# Patient Record
Sex: Male | Born: 1975
Health system: Southern US, Community
[De-identification: ages and names within clinical notes are randomized; demographics above are authoritative.]

## PROBLEM LIST (undated history)

## (undated) DIAGNOSIS — K219 Gastro-esophageal reflux disease without esophagitis: Secondary | ICD-10-CM

---

## 2003-07-10 ENCOUNTER — Encounter: Admission: RE | Admit: 2003-07-10 | Discharge: 2003-08-21 | Payer: Self-pay | Admitting: Family Medicine

## 2012-02-01 ENCOUNTER — Emergency Department (INDEPENDENT_AMBULATORY_CARE_PROVIDER_SITE_OTHER)
Admission: EM | Admit: 2012-02-01 | Discharge: 2012-02-01 | Disposition: A | Discharge status: Home / Self Care | Payer: Self-pay | Source: Home / Self Care | Attending: Family Medicine | Admitting: Family Medicine

## 2012-02-01 ENCOUNTER — Emergency Department (INDEPENDENT_AMBULATORY_CARE_PROVIDER_SITE_OTHER): Payer: Self-pay

## 2012-02-01 ENCOUNTER — Encounter (HOSPITAL_COMMUNITY): Payer: Self-pay | Admitting: *Deleted

## 2012-02-01 DIAGNOSIS — J111 Influenza due to unidentified influenza virus with other respiratory manifestations: Secondary | ICD-10-CM

## 2012-02-01 MED ORDER — HYDROCOD POLST-CHLORPHEN POLST 10-8 MG/5ML PO LQCR: 5.0000 mL | Freq: Two times a day (BID) | ORAL | Status: DC | PRN

## 2012-02-01 MED ORDER — BENZONATATE 200 MG PO CAPS: 200.0000 mg | ORAL_CAPSULE | Freq: Three times a day (TID) | ORAL | Status: DC | PRN

## 2012-02-01 NOTE — ED Notes (Signed)
Pt   Reports  Symptoms  Of           Cough   Congestion  /  Chills  X  5  Days   Pt    Reports                   Symptoms  Not  releived  By  otc     meds        He  Is  Speaking in  Complete  sentances

## 2012-02-01 NOTE — ED Provider Notes (Signed)
History     CSN: [id number]  Arrival date & time 02/01/12  1139   First MD Initiated Contact with Patient 02/01/12 1416      Chief Complaint  Patient presents with  . Cough    (Consider location/radiation/quality/duration/timing/severity/associated sxs/prior treatment) Patient is a 36 y.o. male presenting with cough. The history is provided by the patient.  Cough This is a new problem. The current episode started more than 2 days ago. The problem has been gradually worsening. The cough is non-productive. The maximum temperature recorded prior to his arrival was 101 to 101.9 F. The fever has been present for 1 to 2 days. Associated symptoms include rhinorrhea. Pertinent negatives include no chills, no sore throat, no myalgias, no shortness of breath and no wheezing. Associated symptoms comments: Vomited x1 on thurs, fever broke after 1 day, no flu shot this season.Marland Kitchen He is a smoker.    History reviewed. No pertinent past medical history.  History reviewed. No pertinent past surgical history.  No family history on file.  History  Substance Use Topics  . Smoking status: Current Every Day Smoker  . Smokeless tobacco: Not on file  . Alcohol Use: Yes      Review of Systems  Constitutional: Positive for fever. Negative for chills.  HENT: Positive for congestion and rhinorrhea. Negative for sore throat.   Respiratory: Positive for cough. Negative for shortness of breath and wheezing.   Gastrointestinal: Positive for vomiting. Negative for nausea and diarrhea.  Musculoskeletal: Negative for myalgias.    Allergies  Review of patient's allergies indicates no known allergies.  Home Medications   Current Outpatient Rx  Name  Route  Sig  Dispense  Refill  . BENZONATATE 200 MG PO CAPS   Oral   Take 1 capsule (200 mg total) by mouth 3 (three) times daily as needed for cough.   20 capsule   0   . HYDROCOD POLST-CPM POLST ER 10-8 MG/5ML PO LQCR   Oral   Take 5 mLs by mouth  every 12 (twelve) hours as needed. As needed for cough   115 mL   1     BP 126/93  Pulse 86  Temp 98.5 F (36.9 C) (Oral)  Resp 16  SpO2 97%  Physical Exam  Nursing note and vitals reviewed. Constitutional: He is oriented to person, place, and time. He appears well-developed and well-nourished.  HENT:  Head: Normocephalic.  Right Ear: External ear normal.  Left Ear: External ear normal.  Nose: Nose normal.  Mouth/Throat: Oropharynx is clear and moist.  Eyes: Pupils are equal, round, and reactive to light.  Neck: Normal range of motion. Neck supple.  Cardiovascular: Normal rate, regular rhythm, normal heart sounds and intact distal pulses.   Pulmonary/Chest: Effort normal. He has rhonchi.  Abdominal: Soft. Bowel sounds are normal.  Lymphadenopathy:    He has no cervical adenopathy.  Neurological: He is alert and oriented to person, place, and time.  Skin: Skin is warm and dry.    ED Course  Procedures (including critical care time)  Labs Reviewed - No data to display Dg Chest 2 View  02/01/2012  **ADDENDUM** CREATED: 02/01/2012 15:08:32  This addendum is to correct a "left/right" error in the original report.  The IMPRESSION should read as follows:  IMPRESSION: Minimal atelectasis left mid lung. Otherwise, negative exam.  **END ADDENDUM** SIGNED BY: Britta Mccreedy, M.D.   02/01/2012  *RADIOLOGY REPORT*  Clinical Data: Low grade fever, sore throat and cough  CHEST -  2 VIEW  Comparison: None.  Findings: The heart, mediastinal, and hilar contours are within normal limits.  There is minimal streaky atelectasis in the left mid lung.  No airspace disease, effusion, or pneumothorax.  The bones are unremarkable.  IMPRESSION: Minimal atelectasis right mid lung.  Otherwise, negative exam.   Original Report Authenticated By: Britta Mccreedy, M.D.      1. Influenza-like illness       MDM  X-rays reviewed and report per radiologist.         Linna Hoff, MD 02/01/12 1515

## 2012-06-27 DIAGNOSIS — D126 Benign neoplasm of colon, unspecified: Secondary | ICD-10-CM

## 2015-08-25 DIAGNOSIS — R030 Elevated blood-pressure reading, without diagnosis of hypertension: Secondary | ICD-10-CM | POA: Diagnosis not present

## 2015-08-25 DIAGNOSIS — S335XXA Sprain of ligaments of lumbar spine, initial encounter: Secondary | ICD-10-CM | POA: Diagnosis not present

## 2016-03-01 DIAGNOSIS — D2330 Other benign neoplasm of skin of unspecified part of face: Secondary | ICD-10-CM | POA: Diagnosis not present

## 2016-03-01 DIAGNOSIS — L700 Acne vulgaris: Secondary | ICD-10-CM | POA: Diagnosis not present

## 2016-05-13 DIAGNOSIS — E782 Mixed hyperlipidemia: Secondary | ICD-10-CM | POA: Diagnosis not present

## 2016-05-13 DIAGNOSIS — F419 Anxiety disorder, unspecified: Secondary | ICD-10-CM | POA: Diagnosis not present

## 2016-05-13 DIAGNOSIS — E781 Pure hyperglyceridemia: Secondary | ICD-10-CM | POA: Diagnosis not present

## 2016-05-13 DIAGNOSIS — F329 Major depressive disorder, single episode, unspecified: Secondary | ICD-10-CM | POA: Diagnosis not present

## 2016-12-07 DIAGNOSIS — B349 Viral infection, unspecified: Secondary | ICD-10-CM | POA: Diagnosis not present

## 2017-05-09 DIAGNOSIS — L700 Acne vulgaris: Secondary | ICD-10-CM | POA: Diagnosis not present

## 2019-07-24 ENCOUNTER — Ambulatory Visit: Payer: Self-pay | Admitting: Family Medicine

## 2019-07-26 ENCOUNTER — Ambulatory Visit: Payer: Self-pay | Admitting: Family Medicine

## 2019-07-26 DIAGNOSIS — Z0289 Encounter for other administrative examinations: Secondary | ICD-10-CM

## 2019-08-07 ENCOUNTER — Inpatient Hospital Stay (HOSPITAL_COMMUNITY)
Admission: EM | Admit: 2019-08-07 | Discharge: 2019-08-11 | DRG: 638 | Disposition: A | Discharge status: Home / Self Care | Payer: BLUE CROSS/BLUE SHIELD | Attending: Internal Medicine | Admitting: Internal Medicine

## 2019-08-07 ENCOUNTER — Encounter (HOSPITAL_COMMUNITY): Payer: Self-pay

## 2019-08-07 ENCOUNTER — Other Ambulatory Visit: Payer: Self-pay

## 2019-08-07 DIAGNOSIS — F17210 Nicotine dependence, cigarettes, uncomplicated: Secondary | ICD-10-CM | POA: Diagnosis present

## 2019-08-07 DIAGNOSIS — E119 Type 2 diabetes mellitus without complications: Secondary | ICD-10-CM | POA: Diagnosis not present

## 2019-08-07 DIAGNOSIS — Z7289 Other problems related to lifestyle: Secondary | ICD-10-CM | POA: Diagnosis not present

## 2019-08-07 DIAGNOSIS — E860 Dehydration: Secondary | ICD-10-CM | POA: Diagnosis not present

## 2019-08-07 DIAGNOSIS — R110 Nausea: Secondary | ICD-10-CM | POA: Diagnosis not present

## 2019-08-07 DIAGNOSIS — Z79899 Other long term (current) drug therapy: Secondary | ICD-10-CM | POA: Diagnosis not present

## 2019-08-07 DIAGNOSIS — E0810 Diabetes mellitus due to underlying condition with ketoacidosis without coma: Secondary | ICD-10-CM

## 2019-08-07 DIAGNOSIS — Z22322 Carrier or suspected carrier of Methicillin resistant Staphylococcus aureus: Secondary | ICD-10-CM | POA: Diagnosis not present

## 2019-08-07 DIAGNOSIS — K219 Gastro-esophageal reflux disease without esophagitis: Secondary | ICD-10-CM | POA: Diagnosis present

## 2019-08-07 DIAGNOSIS — R739 Hyperglycemia, unspecified: Secondary | ICD-10-CM | POA: Diagnosis not present

## 2019-08-07 DIAGNOSIS — R404 Transient alteration of awareness: Secondary | ICD-10-CM | POA: Diagnosis not present

## 2019-08-07 DIAGNOSIS — R112 Nausea with vomiting, unspecified: Secondary | ICD-10-CM

## 2019-08-07 DIAGNOSIS — Z833 Family history of diabetes mellitus: Secondary | ICD-10-CM

## 2019-08-07 DIAGNOSIS — E1110 Type 2 diabetes mellitus with ketoacidosis without coma: Principal | ICD-10-CM | POA: Diagnosis present

## 2019-08-07 DIAGNOSIS — E1165 Type 2 diabetes mellitus with hyperglycemia: Secondary | ICD-10-CM | POA: Diagnosis not present

## 2019-08-07 DIAGNOSIS — R000 Tachycardia, unspecified: Secondary | ICD-10-CM | POA: Diagnosis not present

## 2019-08-07 DIAGNOSIS — N179 Acute kidney failure, unspecified: Secondary | ICD-10-CM | POA: Diagnosis not present

## 2019-08-07 DIAGNOSIS — Z20822 Contact with and (suspected) exposure to covid-19: Secondary | ICD-10-CM | POA: Diagnosis present

## 2019-08-07 DIAGNOSIS — R0689 Other abnormalities of breathing: Secondary | ICD-10-CM | POA: Diagnosis not present

## 2019-08-07 DIAGNOSIS — E1010 Type 1 diabetes mellitus with ketoacidosis without coma: Secondary | ICD-10-CM | POA: Diagnosis not present

## 2019-08-07 HISTORY — DX: Gastro-esophageal reflux disease without esophagitis: K21.9

## 2019-08-07 LAB — CBC WITH DIFFERENTIAL/PLATELET
Abs Immature Granulocytes: 0.42 10*3/uL — ABNORMAL HIGH (ref 0.00–0.07)
Basophils Absolute: 0.1 10*3/uL (ref 0.0–0.1)
Basophils Relative: 0 %
Eosinophils Absolute: 0.1 10*3/uL (ref 0.0–0.5)
Eosinophils Relative: 1 %
HCT: 53.1 % — ABNORMAL HIGH (ref 39.0–52.0)
Hemoglobin: 17.3 g/dL — ABNORMAL HIGH (ref 13.0–17.0)
Immature Granulocytes: 2 %
Lymphocytes Relative: 7 %
Lymphs Abs: 1.5 10*3/uL (ref 0.7–4.0)
MCH: 28.5 pg (ref 26.0–34.0)
MCHC: 32.6 g/dL (ref 30.0–36.0)
MCV: 87.3 fL (ref 80.0–100.0)
Monocytes Absolute: 2.1 10*3/uL — ABNORMAL HIGH (ref 0.1–1.0)
Monocytes Relative: 10 %
Neutro Abs: 16.5 10*3/uL — ABNORMAL HIGH (ref 1.7–7.7)
Neutrophils Relative %: 80 %
Platelets: 326 10*3/uL (ref 150–400)
RBC: 6.08 MIL/uL — ABNORMAL HIGH (ref 4.22–5.81)
RDW: 12.5 % (ref 11.5–15.5)
WBC: 20.7 10*3/uL — ABNORMAL HIGH (ref 4.0–10.5)
nRBC: 0 % (ref 0.0–0.2)

## 2019-08-07 LAB — COMPREHENSIVE METABOLIC PANEL
ALT: 135 U/L — ABNORMAL HIGH (ref 0–44)
AST: 37 U/L (ref 15–41)
Albumin: 4.2 g/dL (ref 3.5–5.0)
Alkaline Phosphatase: 115 U/L (ref 38–126)
Anion gap: 32 — ABNORMAL HIGH (ref 5–15)
BUN: 48 mg/dL — ABNORMAL HIGH (ref 6–20)
CO2: 10 mmol/L — ABNORMAL LOW (ref 22–32)
Calcium: 9 mg/dL (ref 8.9–10.3)
Chloride: 90 mmol/L — ABNORMAL LOW (ref 98–111)
Creatinine, Ser: 2.41 mg/dL — ABNORMAL HIGH (ref 0.61–1.24)
GFR calc Af Amer: 36 mL/min — ABNORMAL LOW (ref >60)
GFR calc non Af Amer: 31 mL/min — ABNORMAL LOW (ref >60)
Glucose, Bld: 943 mg/dL (ref 70–99)
Potassium: 5.4 mmol/L — ABNORMAL HIGH (ref 3.5–5.1)
Sodium: 132 mmol/L — ABNORMAL LOW (ref 135–145)
Total Bilirubin: 2.3 mg/dL — ABNORMAL HIGH (ref 0.3–1.2)
Total Protein: 7.4 g/dL (ref 6.5–8.1)

## 2019-08-07 LAB — BLOOD GAS, VENOUS
Acid-base deficit: 21.2 mmol/L — ABNORMAL HIGH (ref 0.0–2.0)
Bicarbonate: 7.6 mmol/L — ABNORMAL LOW (ref 20.0–28.0)
O2 Saturation: 59.9 %
Patient temperature: 98.6
pCO2, Ven: 23.6 mmHg — ABNORMAL LOW (ref 44.0–60.0)
pH, Ven: 7.132 — CL (ref 7.250–7.430)
pO2, Ven: 46.3 mmHg — ABNORMAL HIGH (ref 32.0–45.0)

## 2019-08-07 LAB — LACTIC ACID, PLASMA: Lactic Acid, Venous: 3.4 mmol/L (ref 0.5–1.9)

## 2019-08-07 LAB — CBG MONITORING, ED: Glucose-Capillary: >600 mg/dL (ref 70–99)

## 2019-08-07 LAB — LIPASE, BLOOD: Lipase: 118 U/L — ABNORMAL HIGH (ref 11–51)

## 2019-08-07 MED ORDER — SODIUM CHLORIDE 0.9 % IV SOLN: INTRAVENOUS | Status: DC

## 2019-08-07 MED ORDER — DEXTROSE-NACL 5-0.45 % IV SOLN: INTRAVENOUS | Status: DC

## 2019-08-07 MED ORDER — HEPARIN SODIUM (PORCINE) 5000 UNIT/ML IJ SOLN
5000.0000 [IU] | Freq: Three times a day (TID) | INTRAMUSCULAR | Status: DC
  Administered 2019-08-08 – 2019-08-11 (×10): 5000 [IU] via SUBCUTANEOUS
  Filled 2019-08-07 (×9): qty 1

## 2019-08-07 MED ORDER — INSULIN REGULAR(HUMAN) IN NACL 100-0.9 UT/100ML-% IV SOLN: INTRAVENOUS | Status: DC

## 2019-08-07 MED ORDER — DEXTROSE 50 % IV SOLN: 0.0000 mL | INTRAVENOUS | Status: DC | PRN

## 2019-08-07 MED ORDER — SODIUM CHLORIDE 0.9 % IV BOLUS
500.0000 mL | Freq: Once | INTRAVENOUS | Status: AC
  Administered 2019-08-08: 500 mL via INTRAVENOUS

## 2019-08-07 MED ORDER — INSULIN REGULAR(HUMAN) IN NACL 100-0.9 UT/100ML-% IV SOLN
INTRAVENOUS | Status: DC
  Administered 2019-08-08: 6 [IU]/h via INTRAVENOUS
  Filled 2019-08-07: qty 100

## 2019-08-07 MED ORDER — SODIUM CHLORIDE 0.9 % IV BOLUS
500.0000 mL | Freq: Once | INTRAVENOUS | Status: AC
  Administered 2019-08-07: 500 mL via INTRAVENOUS

## 2019-08-07 MED ORDER — SODIUM CHLORIDE 0.9 % IV BOLUS
1000.0000 mL | Freq: Once | INTRAVENOUS | Status: AC
  Administered 2019-08-08: 1000 mL via INTRAVENOUS

## 2019-08-07 NOTE — ED Triage Notes (Addendum)
Pt BIB EMS from home. Pts sister reports loss of appetite and emesis x10 days. Pt went to UC yesterday and was given Zofran without relief. A&O x4.   CBG 560 4mg  Zofran 18G LAC 1L of NS HR 108 BP 108/60 99% RA RR 30

## 2019-08-07 NOTE — ED Provider Notes (Signed)
[facility] DEPT Provider Note   CSN: [id number] Arrival date & time: 08/07/19  2145     History Chief Complaint  Patient presents with  . Emesis  . Hyperglycemia    Gary Ramirez is a 44 y.o. male.  The history is provided by the patient and medical records. No language interpreter was used.  Emesis Severity:  Severe Duration:  11 days Timing:  Constant Quality:  Stomach contents Progression:  Worsening Chronicity:  New Recent urination:  Increased Relieved by:  Nothing Worsened by:  Nothing Ineffective treatments:  None tried Associated symptoms: abdominal pain   Associated symptoms: no chills, no cough, no diarrhea, no fever, no headaches, no myalgias, no sore throat and no URI   Risk factors: no diabetes   Hyperglycemia Associated symptoms: abdominal pain, fatigue, nausea and vomiting   Associated symptoms: no chest pain, no confusion, no diaphoresis, no dizziness, no dysuria, no fever, no shortness of breath and no weakness        Past Medical History:  Diagnosis Date  . GERD (gastroesophageal reflux disease)     There are no problems to display for this patient.   History reviewed. No pertinent surgical history.     History reviewed. No pertinent family history.  Social History   Tobacco Use  . Smoking status: Current Every Day Smoker  Substance Use Topics  . Alcohol use: Yes  . Drug use: Not on file    Home Medications Prior to Admission medications   Medication Sig Start Date End Date Taking? Authorizing Provider  benzonatate (TESSALON) 200 MG capsule Take 1 capsule (200 mg total) by mouth 3 (three) times daily as needed for cough. 02/01/12   Billy Fischer, MD  chlorpheniramine-HYDROcodone (TUSSIONEX PENNKINETIC ER) 10-8 MG/5ML LQCR Take 5 mLs by mouth every 12 (twelve) hours as needed. As needed for cough 02/01/12   Billy Fischer, MD  FLUoxetine (PROZAC) 40 MG capsule Take 40 mg by mouth at bedtime.  05/23/19   [provider]  pantoprazole (PROTONIX) 40 MG tablet Take 1 tablet by mouth daily. 05/28/19   [provider]    Allergies    Patient has no known allergies.  Review of Systems   Review of Systems  Constitutional: Positive for appetite change and fatigue. Negative for chills, diaphoresis and fever.  HENT: Negative for congestion and sore throat.   Eyes: Negative for visual disturbance.  Respiratory: Negative for cough, choking, chest tightness, shortness of breath, wheezing and stridor.   Cardiovascular: Negative for chest pain, palpitations and leg swelling.  Gastrointestinal: Positive for abdominal pain, nausea and vomiting. Negative for abdominal distention, anal bleeding, blood in stool, constipation and diarrhea.  Genitourinary: Negative for dysuria, flank pain and frequency.  Musculoskeletal: Negative for back pain, myalgias, neck pain and neck stiffness.  Skin: Negative for rash and wound.  Neurological: Negative for dizziness, weakness, light-headedness and headaches.  Psychiatric/Behavioral: Negative for agitation, behavioral problems and confusion.  All other systems reviewed and are negative.   Physical Exam Updated Vital Signs BP 122/71 (BP Location: Right Arm)   Pulse (!) 108   Temp (!) 97.2 F (36.2 C) (Axillary)   Resp (!) 22   SpO2 100%   Physical Exam Vitals and nursing note reviewed.  Constitutional:      General: He is not in acute distress.    Appearance: He is well-developed. He is ill-appearing. He is not toxic-appearing or diaphoretic.  HENT:     Head: Normocephalic and  atraumatic.     Nose: No congestion or rhinorrhea.     Mouth/Throat:     Mouth: Mucous membranes are dry.     Pharynx: No oropharyngeal exudate or posterior oropharyngeal erythema.  Eyes:     Extraocular Movements: Extraocular movements intact.     Conjunctiva/sclera: Conjunctivae normal.     Pupils: Pupils are equal, round, and reactive to light.    Cardiovascular:     Rate and Rhythm: Regular rhythm. Tachycardia present.     Pulses: Normal pulses.     Heart sounds: No murmur heard.   Pulmonary:     Effort: Pulmonary effort is normal. No respiratory distress.     Breath sounds: Normal breath sounds. No wheezing, rhonchi or rales.  Chest:     Chest wall: No tenderness.  Abdominal:     General: Abdomen is flat.     Palpations: Abdomen is soft.     Tenderness: There is no abdominal tenderness. There is no right CVA tenderness, left CVA tenderness, guarding or rebound.  Musculoskeletal:        General: No tenderness.     Cervical back: Neck supple. No tenderness.     Right lower leg: No edema.     Left lower leg: No edema.  Skin:    General: Skin is warm and dry.     Capillary Refill: Capillary refill takes less than 2 seconds.     Findings: No erythema.  Neurological:     Mental Status: He is alert.     Sensory: No sensory deficit.     Motor: No weakness.  Psychiatric:        Mood and Affect: Mood normal.     ED Results / Procedures / Treatments   Labs (all labs ordered are listed, but only abnormal results are displayed) Labs Reviewed  CBC WITH DIFFERENTIAL/PLATELET - Abnormal; Notable for the following components:      Result Value   WBC 20.7 (*)    RBC 6.08 (*)    Hemoglobin 17.3 (*)    HCT 53.1 (*)    Neutro Abs 16.5 (*)    Monocytes Absolute 2.1 (*)    Abs Immature Granulocytes 0.42 (*)    All other components within normal limits  COMPREHENSIVE METABOLIC PANEL - Abnormal; Notable for the following components:   Sodium 132 (*)    Potassium 5.4 (*)    Chloride 90 (*)    CO2 10 (*)    Glucose, Bld 943 (*)    BUN 48 (*)    Creatinine, Ser 2.41 (*)    ALT 135 (*)    Total Bilirubin 2.3 (*)    GFR calc non Af Amer 31 (*)    GFR calc Af Amer 36 (*)    Anion gap 32 (*)    All other components within normal limits  LIPASE, BLOOD - Abnormal; Notable for the following components:   Lipase 118 (*)    All  other components within normal limits  LACTIC ACID, PLASMA - Abnormal; Notable for the following components:   Lactic Acid, Venous 3.4 (*)    All other components within normal limits  BETA-HYDROXYBUTYRIC ACID - Abnormal; Notable for the following components:   Beta-Hydroxybutyric Acid >8.00 (*)    All other components within normal limits  BLOOD GAS, VENOUS - Abnormal; Notable for the following components:   pH, Ven 7.132 (*)    pCO2, Ven 23.6 (*)    pO2, Ven 46.3 (*)    Bicarbonate  7.6 (*)    Acid-base deficit 21.2 (*)    All other components within normal limits  CBG MONITORING, ED - Abnormal; Notable for the following components:   Glucose-Capillary >600 (*)    All other components within normal limits  CBG MONITORING, ED - Abnormal; Notable for the following components:   Glucose-Capillary >600 (*)    All other components within normal limits  URINE CULTURE  SARS CORONAVIRUS 2 BY RT PCR (HOSPITAL ORDER, [facility])  LACTIC ACID, PLASMA  URINALYSIS, ROUTINE W REFLEX MICROSCOPIC  HIV ANTIBODY (ROUTINE TESTING W REFLEX)  BASIC METABOLIC PANEL  BASIC METABOLIC PANEL  BASIC METABOLIC PANEL  BASIC METABOLIC PANEL  BASIC METABOLIC PANEL  HEMOGLOBIN A1C  C-PEPTIDE    EKG EKG Interpretation  Date/Time:  Tuesday August 07 2019 22:47:13 EDT Ventricular Rate:  107 PR Interval:    QRS Duration: 102 QT Interval:  411 QTC Calculation: 549 R Axis:   112 Text Interpretation: Sinus tachycardia Atrial premature complexes Right axis deviation Prolonged QT interval NO prior ECG for comparison. Long QTC. No STEMI Confirmed by Antony Blackbird [phone]) on 08/07/2019 10:59:37 PM   Radiology No results found.  Procedures Procedures (including critical care time)  CRITICAL CARE Performed by: Gwenyth Allegra Izaias Krupka Total critical care time: 45 minutes Critical care time was exclusive of separately billable procedures and treating other patients. Critical care was  necessary to treat or prevent imminent or life-threatening deterioration. Critical care was time spent personally by me on the following activities: development of treatment plan with patient and/or surrogate as well as nursing, discussions with consultants, evaluation of patient's response to treatment, examination of patient, obtaining history from patient or surrogate, ordering and performing treatments and interventions, ordering and review of laboratory studies, ordering and review of radiographic studies, pulse oximetry and re-evaluation of patient's condition.   Medications Ordered in ED Medications  insulin regular, human (MYXREDLIN) 100 units/ 100 mL infusion (has no administration in time range)  0.9 %  sodium chloride infusion (has no administration in time range)  dextrose 5 %-0.45 % sodium chloride infusion (has no administration in time range)  dextrose 50 % solution 0-50 mL (has no administration in time range)  heparin injection 5,000 Units (has no administration in time range)  sodium chloride 0.9 % bolus 500 mL (0 mLs Intravenous Stopped 08/08/19 0014)  sodium chloride 0.9 % bolus 1,000 mL (1,000 mLs Intravenous New Bag/Given 08/08/19 0014)  sodium chloride 0.9 % bolus 500 mL (500 mLs Intravenous New Bag/Given 08/08/19 0010)    ED Course  I have reviewed the triage vital signs and the nursing notes.  Pertinent labs & imaging results that were available during my care of the patient were reviewed by me and considered in my medical decision making (see chart for details).    MDM Rules/Calculators/A&P                          Gary Ramirez is a 44 y.o. male with a past medical history significant for GERD who presents with nausea, vomiting, fatigue, altered mental status, polyuria, and dehydration.  Patient reports that for the last week and half he has been having near constant nausea vomiting with any p.o. intake.  He reports some mild intermittent abdominal discomfort.  He  went to urgent care yesterday and was given some Zofran without significant relief.  He is accompanied by his sister who is able to provide a  lot of history.  Patient is disoriented to time and feels he is very dehydrated.  He reports he has been peeing more but denies dysuria.  Denies significant fevers, chills, congestion, or cough.  Denies constipation or diarrhea.  Reports just constant vomiting.  He does deny any history of diabetes but his sister reports that you have a family history of late onset diabetes.  He has not been trying other medication to help.  On exam, patient is tachycardic but is afebrile.  He is not hypotensive.  He is laying on his exam bed and is answering questions but he is very tired appearing.  He is not somnolent as he is able to answer questions to voice.  Abdomen is nontender.  Normal bowel sounds.  Chest is nontender.  Lungs clear.  No murmur.  Patient moving all extremities with normal sensation and strength in extremities.  Pupils are symmetric reactive normal extraocular movements.  No evidence of trauma.  No CVA tenderness.  Patient had a CBG that was greater than 600.  Clinically I am very concerned about DKA.  Patient received 1 L of fluids with EMS, will give 500 to start and get his labs to confirm DKA.  He is already some Zofran and nausea starting to improve.  At this time, do not suspect any cerebral edema given his lack of headache or neurologic deficits but he is very tired.  Suspect is related dehydration.  Will wait for labs to return and then continue his hyperglycemia management.  Anticipate admission for likely new onset diabetes and possible DKA.  We will go ahead and get Covid swab.  11:43 PM Labs have returned and we have confirmed DKA.  pH is 7.13.  Bicarb is low at 10. at patient has AKI of 2.41.  Glucose is 943 on CMP.  Also the leukocytosis may be from demargination with vomiting.  Lipase elevated.  Anion gap calculated to be 32.  We will start the  insulin drip and admit for further management to medicine service.    Final Clinical Impression(s) / ED Diagnoses Final diagnoses:  Diabetic ketoacidosis without coma associated with diabetes mellitus due to underlying condition ([place])  Hyperglycemia  Intractable vomiting with nausea, unspecified vomiting type     Clinical Impression: 1. Diabetic ketoacidosis without coma associated with diabetes mellitus due to underlying condition ([place])   2. Hyperglycemia   3. Intractable vomiting with nausea, unspecified vomiting type     Disposition: Admit  This note was prepared with assistance of Dragon voice recognition software. Occasional wrong-word or sound-a-like substitutions may have occurred due to the inherent limitations of voice recognition software.     Townes Fuhs, Gwenyth Allegra, MD 08/08/19 [phone]

## 2019-08-08 ENCOUNTER — Encounter (HOSPITAL_COMMUNITY): Payer: Self-pay | Admitting: Internal Medicine

## 2019-08-08 DIAGNOSIS — N179 Acute kidney failure, unspecified: Secondary | ICD-10-CM

## 2019-08-08 DIAGNOSIS — E1110 Type 2 diabetes mellitus with ketoacidosis without coma: Principal | ICD-10-CM

## 2019-08-08 DIAGNOSIS — R739 Hyperglycemia, unspecified: Secondary | ICD-10-CM

## 2019-08-08 DIAGNOSIS — R112 Nausea with vomiting, unspecified: Secondary | ICD-10-CM

## 2019-08-08 DIAGNOSIS — E119 Type 2 diabetes mellitus without complications: Secondary | ICD-10-CM

## 2019-08-08 LAB — RAPID URINE DRUG SCREEN, HOSP PERFORMED
Amphetamines: NOT DETECTED
Barbiturates: NOT DETECTED
Benzodiazepines: NOT DETECTED
Cocaine: NOT DETECTED
Opiates: NOT DETECTED
Tetrahydrocannabinol: POSITIVE — AB

## 2019-08-08 LAB — GLUCOSE, CAPILLARY
Glucose-Capillary: 216 mg/dL — ABNORMAL HIGH (ref 70–99)
Glucose-Capillary: 216 mg/dL — ABNORMAL HIGH (ref 70–99)
Glucose-Capillary: 218 mg/dL — ABNORMAL HIGH (ref 70–99)
Glucose-Capillary: 228 mg/dL — ABNORMAL HIGH (ref 70–99)
Glucose-Capillary: 246 mg/dL — ABNORMAL HIGH (ref 70–99)
Glucose-Capillary: 257 mg/dL — ABNORMAL HIGH (ref 70–99)
Glucose-Capillary: 264 mg/dL — ABNORMAL HIGH (ref 70–99)
Glucose-Capillary: 264 mg/dL — ABNORMAL HIGH (ref 70–99)
Glucose-Capillary: 265 mg/dL — ABNORMAL HIGH (ref 70–99)
Glucose-Capillary: 265 mg/dL — ABNORMAL HIGH (ref 70–99)
Glucose-Capillary: 279 mg/dL — ABNORMAL HIGH (ref 70–99)
Glucose-Capillary: 295 mg/dL — ABNORMAL HIGH (ref 70–99)
Glucose-Capillary: 329 mg/dL — ABNORMAL HIGH (ref 70–99)
Glucose-Capillary: 376 mg/dL — ABNORMAL HIGH (ref 70–99)
Glucose-Capillary: 408 mg/dL — ABNORMAL HIGH (ref 70–99)
Glucose-Capillary: 437 mg/dL — ABNORMAL HIGH (ref 70–99)
Glucose-Capillary: 450 mg/dL — ABNORMAL HIGH (ref 70–99)
Glucose-Capillary: 467 mg/dL — ABNORMAL HIGH (ref 70–99)
Glucose-Capillary: 566 mg/dL (ref 70–99)
Glucose-Capillary: >600 mg/dL (ref 70–99)
Glucose-Capillary: >600 mg/dL (ref 70–99)
Glucose-Capillary: >600 mg/dL (ref 70–99)
Glucose-Capillary: >600 mg/dL (ref 70–99)

## 2019-08-08 LAB — BASIC METABOLIC PANEL
Anion gap: 32 — ABNORMAL HIGH (ref 5–15)
Anion gap: 19 — ABNORMAL HIGH (ref 5–15)
Anion gap: 19 — ABNORMAL HIGH (ref 5–15)
Anion gap: 18 — ABNORMAL HIGH (ref 5–15)
Anion gap: 14 (ref 5–15)
BUN: 50 mg/dL — ABNORMAL HIGH (ref 6–20)
BUN: 47 mg/dL — ABNORMAL HIGH (ref 6–20)
BUN: 41 mg/dL — ABNORMAL HIGH (ref 6–20)
BUN: 38 mg/dL — ABNORMAL HIGH (ref 6–20)
BUN: 34 mg/dL — ABNORMAL HIGH (ref 6–20)
BUN: 31 mg/dL — ABNORMAL HIGH (ref 6–20)
CO2: <7 mmol/L — ABNORMAL LOW (ref 22–32)
CO2: 7 mmol/L — ABNORMAL LOW (ref 22–32)
CO2: 13 mmol/L — ABNORMAL LOW (ref 22–32)
CO2: 15 mmol/L — ABNORMAL LOW (ref 22–32)
CO2: 16 mmol/L — ABNORMAL LOW (ref 22–32)
CO2: 17 mmol/L — ABNORMAL LOW (ref 22–32)
Calcium: 8.6 mg/dL — ABNORMAL LOW (ref 8.9–10.3)
Calcium: 8.9 mg/dL (ref 8.9–10.3)
Calcium: 8.6 mg/dL — ABNORMAL LOW (ref 8.9–10.3)
Calcium: 8.9 mg/dL (ref 8.9–10.3)
Calcium: 8.6 mg/dL — ABNORMAL LOW (ref 8.9–10.3)
Calcium: 9 mg/dL (ref 8.9–10.3)
Chloride: 102 mmol/L (ref 98–111)
Chloride: 101 mmol/L (ref 98–111)
Chloride: 115 mmol/L — ABNORMAL HIGH (ref 98–111)
Chloride: 116 mmol/L — ABNORMAL HIGH (ref 98–111)
Chloride: 115 mmol/L — ABNORMAL HIGH (ref 98–111)
Chloride: 118 mmol/L — ABNORMAL HIGH (ref 98–111)
Creatinine, Ser: 2.21 mg/dL — ABNORMAL HIGH (ref 0.61–1.24)
Creatinine, Ser: 2.27 mg/dL — ABNORMAL HIGH (ref 0.61–1.24)
Creatinine, Ser: 1.85 mg/dL — ABNORMAL HIGH (ref 0.61–1.24)
Creatinine, Ser: 1.64 mg/dL — ABNORMAL HIGH (ref 0.61–1.24)
Creatinine, Ser: 1.61 mg/dL — ABNORMAL HIGH (ref 0.61–1.24)
Creatinine, Ser: 1.37 mg/dL — ABNORMAL HIGH (ref 0.61–1.24)
GFR calc Af Amer: 40 mL/min — ABNORMAL LOW (ref >60)
GFR calc Af Amer: 39 mL/min — ABNORMAL LOW (ref >60)
GFR calc Af Amer: 50 mL/min — ABNORMAL LOW (ref >60)
GFR calc Af Amer: 58 mL/min — ABNORMAL LOW (ref >60)
GFR calc Af Amer: 59 mL/min — ABNORMAL LOW (ref >60)
GFR calc Af Amer: >60 mL/min (ref >60)
GFR calc non Af Amer: 35 mL/min — ABNORMAL LOW (ref >60)
GFR calc non Af Amer: 34 mL/min — ABNORMAL LOW (ref >60)
GFR calc non Af Amer: 43 mL/min — ABNORMAL LOW (ref >60)
GFR calc non Af Amer: 50 mL/min — ABNORMAL LOW (ref >60)
GFR calc non Af Amer: 51 mL/min — ABNORMAL LOW (ref >60)
GFR calc non Af Amer: >60 mL/min (ref >60)
Glucose, Bld: 761 mg/dL (ref 70–99)
Glucose, Bld: 687 mg/dL (ref 70–99)
Glucose, Bld: 363 mg/dL — ABNORMAL HIGH (ref 70–99)
Glucose, Bld: 256 mg/dL — ABNORMAL HIGH (ref 70–99)
Glucose, Bld: 255 mg/dL — ABNORMAL HIGH (ref 70–99)
Glucose, Bld: 257 mg/dL — ABNORMAL HIGH (ref 70–99)
Potassium: 3.6 mmol/L (ref 3.5–5.1)
Potassium: 3.7 mmol/L (ref 3.5–5.1)
Potassium: 4.1 mmol/L (ref 3.5–5.1)
Potassium: 4 mmol/L (ref 3.5–5.1)
Potassium: 4.1 mmol/L (ref 3.5–5.1)
Potassium: 3.5 mmol/L (ref 3.5–5.1)
Sodium: 138 mmol/L (ref 135–145)
Sodium: 140 mmol/L (ref 135–145)
Sodium: 147 mmol/L — ABNORMAL HIGH (ref 135–145)
Sodium: 150 mmol/L — ABNORMAL HIGH (ref 135–145)
Sodium: 149 mmol/L — ABNORMAL HIGH (ref 135–145)
Sodium: 149 mmol/L — ABNORMAL HIGH (ref 135–145)

## 2019-08-08 LAB — URINALYSIS, ROUTINE W REFLEX MICROSCOPIC
Bilirubin Urine: NEGATIVE
Glucose, UA: >=500 mg/dL — AB
Ketones, ur: 80 mg/dL — AB
Leukocytes,Ua: NEGATIVE
Nitrite: NEGATIVE
Protein, ur: NEGATIVE mg/dL
Specific Gravity, Urine: 1.025 (ref 1.005–1.030)
pH: 5 (ref 5.0–8.0)

## 2019-08-08 LAB — HEMOGLOBIN A1C
Hgb A1c MFr Bld: 12.3 % — ABNORMAL HIGH (ref 4.8–5.6)
Mean Plasma Glucose: 306.31 mg/dL

## 2019-08-08 LAB — MRSA PCR SCREENING: MRSA by PCR: POSITIVE — AB

## 2019-08-08 LAB — CBG MONITORING, ED
Glucose-Capillary: >600 mg/dL (ref 70–99)
Glucose-Capillary: >600 mg/dL (ref 70–99)

## 2019-08-08 LAB — HIV ANTIBODY (ROUTINE TESTING W REFLEX): HIV Screen 4th Generation wRfx: NONREACTIVE

## 2019-08-08 LAB — LACTIC ACID, PLASMA: Lactic Acid, Venous: 2.2 mmol/L (ref 0.5–1.9)

## 2019-08-08 LAB — BETA-HYDROXYBUTYRIC ACID: Beta-Hydroxybutyric Acid: >8 mmol/L — ABNORMAL HIGH (ref 0.05–0.27)

## 2019-08-08 LAB — SARS CORONAVIRUS 2 BY RT PCR (HOSPITAL ORDER, PERFORMED IN ~~LOC~~ HOSPITAL LAB): SARS Coronavirus 2: NEGATIVE

## 2019-08-08 MED ORDER — ORAL CARE MOUTH RINSE
15.0000 mL | Freq: Two times a day (BID) | OROMUCOSAL | Status: DC
  Administered 2019-08-08 – 2019-08-10 (×6): 15 mL via OROMUCOSAL

## 2019-08-08 MED ORDER — CHLORHEXIDINE GLUCONATE CLOTH 2 % EX PADS
6.0000 | MEDICATED_PAD | Freq: Every day | CUTANEOUS | Status: DC
  Administered 2019-08-08 – 2019-08-10 (×3): 6 via TOPICAL

## 2019-08-08 MED ORDER — SODIUM CHLORIDE 0.9 % IV BOLUS
1000.0000 mL | Freq: Once | INTRAVENOUS | Status: AC
  Administered 2019-08-08: 1000 mL via INTRAVENOUS

## 2019-08-08 MED ORDER — MUPIROCIN 2 % EX OINT
TOPICAL_OINTMENT | Freq: Two times a day (BID) | CUTANEOUS | Status: DC
  Administered 2019-08-08: 1 via NASAL
  Filled 2019-08-08 (×2): qty 22

## 2019-08-08 MED ORDER — INSULIN REGULAR(HUMAN) IN NACL 100-0.9 UT/100ML-% IV SOLN
INTRAVENOUS | Status: DC
  Administered 2019-08-08: 7 [IU]/h via INTRAVENOUS
  Administered 2019-08-09: 4 [IU]/h via INTRAVENOUS
  Filled 2019-08-08 (×2): qty 100

## 2019-08-08 MED ORDER — ONDANSETRON HCL 4 MG/2ML IJ SOLN
4.0000 mg | Freq: Three times a day (TID) | INTRAMUSCULAR | Status: DC | PRN
  Administered 2019-08-08: 4 mg via INTRAVENOUS
  Filled 2019-08-08: qty 2

## 2019-08-08 MED ORDER — POTASSIUM CHLORIDE 10 MEQ/100ML IV SOLN
10.0000 meq | INTRAVENOUS | Status: AC
  Administered 2019-08-08 (×2): 10 meq via INTRAVENOUS
  Filled 2019-08-08 (×2): qty 100

## 2019-08-08 MED ORDER — LIVING WELL WITH DIABETES BOOK
Freq: Once | Status: DC
  Filled 2019-08-08 (×2): qty 1

## 2019-08-08 NOTE — Progress Notes (Signed)
Inpatient Diabetes Program Recommendations  AACE/ADA: New Consensus Statement on Inpatient Glycemic Control (2015)  Target Ranges:  Prepandial:   less than 140 mg/dL      Peak postprandial:   less than 180 mg/dL (1-2 hours)      Critically ill patients:  140 - 180 mg/dL   Lab Results  Component Value Date   GLUCAP 257 (H) 08/08/2019   HGBA1C 12.3 (H) 08/08/2019    Review of Glycemic Control  Diabetes history: New onset Outpatient Diabetes medications: None Current orders for Inpatient glycemic control: IV insulin per [employer]    HgbA1C - 12.3% 687, 363 mg/dL Per RN, not appropriate to speak with pt at present about his new onset DM. Ordered Living Well book and will check back later on today or in am.   Inpatient Diabetes Program Recommendations:     Continue with insulin drip. Not ready for transition to SQ insulin.    Will follow-up when appropriate.  Thank you. Lorenda Peck, RD, LDN, CDE Inpatient Diabetes Coordinator [phone]

## 2019-08-08 NOTE — Progress Notes (Addendum)
Giving 3L NS bolus. K 3.6: ordering 2 runs IV K.  Repeat BMP to see where BGL is at is pending since its still >600.  Mental status seems to be improving somewhat.  Drowsy but now awake and answering some yes/no questions.  Asking for ice chips.  Indicates he has very dry mouth and feels dehydrated.

## 2019-08-08 NOTE — Care Plan (Signed)
Gary Ramirez is a 44 y.o. male with medical history significant of GERD presents in the emergency department with nausea and vomiting of past 10 days.  History is obtained from sister at bedside who reports that he was having polyuria,  polydipsia and fatigue for last few months and he was about to establish a primary care physician.  Denies any fevers, URIs.  He went to urgent care and found to have a blood glucose of more than 600 and was sent to the ED.  In the ED he was found to be in DKA with a blood glucose of 943, acute kidney injury with a creatinine of 2.2, lactic acid of 3.4,  bicarb of 10.  Patient was seen and examined at bedside.  He is awake alert and oriented.  He was admitted for high anion gap metabolic acidosis secondary to diabetic ketoacidosis.   Assessment/Plan Principal Problem:   DKA (diabetic ketoacidoses) ([facility]) Active Problems:   AKI (acute kidney injury) ([place])   Diabetes mellitus, new onset ([place])    1. DKA - new onset DM 1. DKA pathway 2. Insulin gtt 3. IVF: 2L bolus then 125 cc/hr NS then 100 cc/hr D5 half 4. BMP Q4H 5. BHB and UA pending - can smell ketones on exam though. 2. New onset DM - DM coordinator consult 3. AKI - 1. Presumably pre-renal due to N/V from DKA 2. IVF as above 3. Strict intake and output 4. Serial BMPs  DVT prophylaxis: Heparin [place] Code Status: Full Family Communication: Family at bedside Disposition Plan: Home after DKA resolved Consults called: None

## 2019-08-08 NOTE — Progress Notes (Signed)
CRITICAL VALUE ALERT  Critical Value:  MRSA positive  Date & Time Notied:  08-08-19 @ 0277  Provider Notified: Dr. Alcario Drought  Orders Received/Actions taken: awaiting response

## 2019-08-08 NOTE — H&P (Signed)
History and Physical    Gary Ramirez UVO:[id number] DOB: [birth year] DOA: 08/07/2019  PCP: Gary Ramirez  Patient coming from: Home  I have personally briefly reviewed patient's old medical records in [place]  Chief Complaint: N/V  HPI: Gary Ramirez is a 44 y.o. male with medical history significant of GERD.  Pt with N/V for past 10 days.  Polyuria, polydipsia, fatigue.  No URI, no cough, no fevers.  Went to UC, had CBG > 600, promptly got sent in to ED.   ED Course: Has DKA with BGL of 943, kidney injury (presumably acute) creat 2.4, BUN 48, pH 7.13.  BHB and UA pending, but I can smell the keytones from hallway.  Lactate 3.4.  Bicarb 10, AG 32.   Review of Systems: As Ramirez HPI, otherwise all review of systems negative.  Past Medical History:  Diagnosis Date   GERD (gastroesophageal reflux disease)     History reviewed. No pertinent surgical history.   reports that he has been smoking. He does not have any smokeless tobacco history on file. He reports current alcohol use. No history on file for drug use.  No Known Allergies  History reviewed. No pertinent family history. Does have FHx of DM.  Prior to Admission medications   Medication Sig Start Date End Date Taking? Authorizing Provider  FLUoxetine (PROZAC) 40 MG capsule Take 40 mg by mouth at bedtime. 05/23/19  Yes [provider]  ondansetron (ZOFRAN-ODT) 4 MG disintegrating tablet Take 4 mg by mouth every 8 (eight) hours as needed for nausea/vomiting. 08/07/19  Yes [provider]  pantoprazole (PROTONIX) 40 MG tablet Take 1 tablet by mouth daily. 05/28/19  Yes [provider]    Physical Exam: Vitals:   08/07/19 2158 08/07/19 2246  BP: 122/71 111/75  Pulse: (!) 108 (!) 108  Resp: (!) 22 (!) 22  Temp: (!) 97.2 F (36.2 C)   TempSrc: Axillary   SpO2: 100% 100%    Constitutional: Drowsy.  Odor of keytones noted. Eyes: PERRL, lids and conjunctivae  normal ENMT: Mucous membranes are moist. Posterior pharynx clear of any exudate or lesions.Normal dentition.  Neck: normal, supple, no masses, no thyromegaly Respiratory: clear to auscultation bilaterally, no wheezing, no crackles. Normal respiratory effort. No accessory muscle use.  Cardiovascular: Regular rate and rhythm, no murmurs / rubs / gallops. No extremity edema. 2+ pedal pulses. No carotid bruits.  Abdomen: no tenderness, no masses palpated. No hepatosplenomegaly. Bowel sounds positive.  Musculoskeletal: no clubbing / cyanosis. No joint deformity upper and lower extremities. Good ROM, no contractures. Normal muscle tone.  Skin: no rashes, lesions, ulcers. No induration Neurologic: CN 2-12 grossly intact. Sensation intact, DTR normal. Strength 5/5 in all 4.  Psychiatric: Normal judgment and insight. Alert and oriented x 3. Normal mood.    Labs on Admission: I have personally reviewed following labs and imaging studies  CBC: Recent Labs  Lab 08/07/19 2230  WBC 20.7*  NEUTROABS 16.5*  HGB 17.3*  HCT 53.1*  MCV 87.3  PLT 742   Basic Metabolic Panel: Recent Labs  Lab 08/07/19 2230  NA 132*  K 5.4*  CL 90*  CO2 10*  GLUCOSE 943*  BUN 48*  CREATININE 2.41*  CALCIUM 9.0   GFR: CrCl cannot be calculated (Unknown ideal weight.). Liver Function Tests: Recent Labs  Lab 08/07/19 2230  AST 37  ALT 135*  ALKPHOS 115  BILITOT 2.3*  PROT 7.4  ALBUMIN 4.2   Recent Labs  Lab 08/07/19 2230  LIPASE 118*   No results for input(s): AMMONIA in the last 168 hours. Coagulation Profile: No results for input(s): INR, PROTIME in the last 168 hours. Cardiac Enzymes: No results for input(s): CKTOTAL, CKMB, CKMBINDEX, TROPONINI in the last 168 hours. BNP (last 3 results) No results for input(s): PROBNP in the last 8760 hours. HbA1C: No results for input(s): HGBA1C in the last 72 hours. CBG: Recent Labs  Lab 08/07/19 2203  GLUCAP >600*   Lipid Profile: No results for  input(s): CHOL, HDL, LDLCALC, TRIG, CHOLHDL, LDLDIRECT in the last 72 hours. Thyroid Function Tests: No results for input(s): TSH, T4TOTAL, FREET4, T3FREE, THYROIDAB in the last 72 hours. Anemia Panel: No results for input(s): VITAMINB12, FOLATE, FERRITIN, TIBC, IRON, RETICCTPCT in the last 72 hours. Urine analysis: No results found for: COLORURINE, APPEARANCEUR, LABSPEC, PHURINE, GLUCOSEU, HGBUR, BILIRUBINUR, KETONESUR, PROTEINUR, UROBILINOGEN, NITRITE, LEUKOCYTESUR  Radiological Exams on Admission: No results found.  EKG: Independently reviewed.  Assessment/Plan Principal Problem:   DKA (diabetic ketoacidoses) (HCC) Active Problems:   AKI (acute kidney injury) ([place])   Diabetes mellitus, new onset ([place])    1. DKA - new onset DM 1. DKA pathway 2. Insulin gtt 3. IVF: 2L bolus then 125 cc/hr NS then 100 cc/hr D5 half 4. BMP Q4H 5. BHB and UA pending - can smell keytones on exam though. 2. New onset DM - DM coordinator consult 3. AKI - 1. Presumably pre-renal due to N/V from DKA 2. IVF as above 3. Strict intake and output 4. Serial BMPs  DVT prophylaxis: Heparin [place] Code Status: Full Family Communication: Family at bedside Disposition Plan: Home after DKA resolved Consults called: None Admission status: Admit to inpatient  Severity of Illness: The appropriate patient status for this patient is INPATIENT. Inpatient status is judged to be reasonable and necessary in order to provide the required intensity of service to ensure the patient's safety. The patient's presenting symptoms, physical exam findings, and initial radiographic and laboratory data in the context of their chronic comorbidities is felt to place them at high risk for further clinical deterioration. Furthermore, it is not anticipated that the patient will be medically stable for discharge from the hospital within 2 midnights of admission. The following factors support the patient status of inpatient.   IP status  for treatment of DKA.   * I certify that at the point of admission it is my clinical judgment that the patient will require inpatient hospital care spanning beyond 2 midnights from the point of admission due to high intensity of service, high risk for further deterioration and high frequency of surveillance required.*    Starr Engel M. DO [facility]  How to contact the [facility] Attending or Consulting provider [place] or covering provider during after hours [place], for this patient?  1. Check the care team in [facility] and look for a) attending/consulting [facility] provider listed and b) the [facility] team listed 2. Log into [email or web address]  Amion Physician Scheduling and messaging for groups and whole hospitals  On call and physician scheduling software for group practices, residents, hospitalists and other medical providers for call, clinic, rotation and shift schedules. OnCall Enterprise is a hospital-wide system for scheduling doctors and paging doctors on call. EasyPlot is for scientific plotting and data analysis.  [email or web address]  and use [place]'s universal password to access. If you do not have the password, please contact the hospital operator.  3. Locate the [facility] provider you are looking for under [facility] and page to a number that you can be directly reached. 4. If you still have difficulty reaching the provider, please page the [facility] (Director on Call) for the Hospitalists listed on amion for assistance.  08/08/2019, 12:14 AM

## 2019-08-09 LAB — BASIC METABOLIC PANEL
Anion gap: 18 — ABNORMAL HIGH (ref 5–15)
Anion gap: 14 (ref 5–15)
Anion gap: 12 (ref 5–15)
BUN: 27 mg/dL — ABNORMAL HIGH (ref 6–20)
BUN: 25 mg/dL — ABNORMAL HIGH (ref 6–20)
BUN: 22 mg/dL — ABNORMAL HIGH (ref 6–20)
CO2: 17 mmol/L — ABNORMAL LOW (ref 22–32)
CO2: 19 mmol/L — ABNORMAL LOW (ref 22–32)
CO2: 22 mmol/L (ref 22–32)
Calcium: 9.3 mg/dL (ref 8.9–10.3)
Calcium: 9 mg/dL (ref 8.9–10.3)
Calcium: 8.8 mg/dL — ABNORMAL LOW (ref 8.9–10.3)
Chloride: 116 mmol/L — ABNORMAL HIGH (ref 98–111)
Chloride: 116 mmol/L — ABNORMAL HIGH (ref 98–111)
Chloride: 112 mmol/L — ABNORMAL HIGH (ref 98–111)
Creatinine, Ser: 1.31 mg/dL — ABNORMAL HIGH (ref 0.61–1.24)
Creatinine, Ser: 1.23 mg/dL (ref 0.61–1.24)
Creatinine, Ser: 1.23 mg/dL (ref 0.61–1.24)
GFR calc Af Amer: >60 mL/min (ref >60)
GFR calc Af Amer: >60 mL/min (ref >60)
GFR calc Af Amer: >60 mL/min (ref >60)
GFR calc non Af Amer: >60 mL/min (ref >60)
GFR calc non Af Amer: >60 mL/min (ref >60)
GFR calc non Af Amer: >60 mL/min (ref >60)
Glucose, Bld: 234 mg/dL — ABNORMAL HIGH (ref 70–99)
Glucose, Bld: 263 mg/dL — ABNORMAL HIGH (ref 70–99)
Glucose, Bld: 271 mg/dL — ABNORMAL HIGH (ref 70–99)
Potassium: 4 mmol/L (ref 3.5–5.1)
Potassium: 3.7 mmol/L (ref 3.5–5.1)
Potassium: 3.5 mmol/L (ref 3.5–5.1)
Sodium: 151 mmol/L — ABNORMAL HIGH (ref 135–145)
Sodium: 149 mmol/L — ABNORMAL HIGH (ref 135–145)
Sodium: 146 mmol/L — ABNORMAL HIGH (ref 135–145)

## 2019-08-09 LAB — GLUCOSE, CAPILLARY
Glucose-Capillary: 213 mg/dL — ABNORMAL HIGH (ref 70–99)
Glucose-Capillary: 214 mg/dL — ABNORMAL HIGH (ref 70–99)
Glucose-Capillary: 214 mg/dL — ABNORMAL HIGH (ref 70–99)
Glucose-Capillary: 219 mg/dL — ABNORMAL HIGH (ref 70–99)
Glucose-Capillary: 222 mg/dL — ABNORMAL HIGH (ref 70–99)
Glucose-Capillary: 225 mg/dL — ABNORMAL HIGH (ref 70–99)
Glucose-Capillary: 227 mg/dL — ABNORMAL HIGH (ref 70–99)
Glucose-Capillary: 228 mg/dL — ABNORMAL HIGH (ref 70–99)
Glucose-Capillary: 237 mg/dL — ABNORMAL HIGH (ref 70–99)
Glucose-Capillary: 241 mg/dL — ABNORMAL HIGH (ref 70–99)
Glucose-Capillary: 243 mg/dL — ABNORMAL HIGH (ref 70–99)
Glucose-Capillary: 249 mg/dL — ABNORMAL HIGH (ref 70–99)
Glucose-Capillary: 273 mg/dL — ABNORMAL HIGH (ref 70–99)
Glucose-Capillary: 366 mg/dL — ABNORMAL HIGH (ref 70–99)
Glucose-Capillary: 369 mg/dL — ABNORMAL HIGH (ref 70–99)

## 2019-08-09 LAB — CBC
HCT: 42.2 % (ref 39.0–52.0)
Hemoglobin: 14.5 g/dL (ref 13.0–17.0)
MCH: 28.7 pg (ref 26.0–34.0)
MCHC: 34.4 g/dL (ref 30.0–36.0)
MCV: 83.4 fL (ref 80.0–100.0)
Platelets: 199 10*3/uL (ref 150–400)
RBC: 5.06 MIL/uL (ref 4.22–5.81)
RDW: 12.8 % (ref 11.5–15.5)
WBC: 13.5 10*3/uL — ABNORMAL HIGH (ref 4.0–10.5)
nRBC: 0 % (ref 0.0–0.2)

## 2019-08-09 LAB — URINE CULTURE: Culture: <10000 — AB

## 2019-08-09 LAB — C-PEPTIDE: C-Peptide: 1.6 ng/mL (ref 1.1–4.4)

## 2019-08-09 LAB — MAGNESIUM: Magnesium: 2.9 mg/dL — ABNORMAL HIGH (ref 1.7–2.4)

## 2019-08-09 LAB — PHOSPHORUS: Phosphorus: 1.8 mg/dL — ABNORMAL LOW (ref 2.5–4.6)

## 2019-08-09 MED ORDER — INSULIN ASPART 100 UNIT/ML ~~LOC~~ SOLN
2.0000 [IU] | SUBCUTANEOUS | Status: DC
  Administered 2019-08-09 – 2019-08-10 (×2): 6 [IU] via SUBCUTANEOUS
  Administered 2019-08-10: 4 [IU] via SUBCUTANEOUS
  Administered 2019-08-10 (×2): 6 [IU] via SUBCUTANEOUS

## 2019-08-09 MED ORDER — PHENOL 1.4 % MT LIQD
1.0000 | OROMUCOSAL | Status: DC | PRN
  Administered 2019-08-09: 1 via OROMUCOSAL
  Filled 2019-08-09: qty 177

## 2019-08-09 MED ORDER — ONDANSETRON HCL 4 MG/2ML IJ SOLN
4.0000 mg | INTRAMUSCULAR | Status: DC | PRN
  Administered 2019-08-09 – 2019-08-10 (×2): 4 mg via INTRAVENOUS
  Filled 2019-08-09 (×2): qty 2

## 2019-08-09 MED ORDER — INSULIN STARTER KIT- PEN NEEDLES (ENGLISH)
1.0000 | Freq: Once | Status: DC
  Filled 2019-08-09: qty 1

## 2019-08-09 MED ORDER — SENNA 8.6 MG PO TABS
1.0000 | ORAL_TABLET | Freq: Every evening | ORAL | Status: DC | PRN
  Administered 2019-08-09 – 2019-08-10 (×2): 8.6 mg via ORAL
  Filled 2019-08-09 (×2): qty 1

## 2019-08-09 MED ORDER — INSULIN ASPART 100 UNIT/ML ~~LOC~~ SOLN
7.0000 [IU] | Freq: Once | SUBCUTANEOUS | Status: AC
  Administered 2019-08-10: 7 [IU] via SUBCUTANEOUS

## 2019-08-09 MED ORDER — INSULIN DETEMIR 100 UNIT/ML ~~LOC~~ SOLN
26.0000 [IU] | Freq: Two times a day (BID) | SUBCUTANEOUS | Status: DC
  Administered 2019-08-09 – 2019-08-11 (×5): 26 [IU] via SUBCUTANEOUS
  Filled 2019-08-09 (×5): qty 0.26

## 2019-08-09 MED ORDER — INSULIN ASPART 100 UNIT/ML ~~LOC~~ SOLN
7.0000 [IU] | Freq: Once | SUBCUTANEOUS | Status: AC
  Administered 2019-08-09: 7 [IU] via SUBCUTANEOUS

## 2019-08-09 MED ORDER — POTASSIUM PHOSPHATES 15 MMOLE/5ML IV SOLN
20.0000 mmol | Freq: Once | INTRAVENOUS | Status: AC
  Administered 2019-08-09: 20 mmol via INTRAVENOUS
  Filled 2019-08-09 (×2): qty 6.67

## 2019-08-09 MED ORDER — POTASSIUM PHOSPHATES 15 MMOLE/5ML IV SOLN
20.0000 mmol | Freq: Once | INTRAVENOUS | Status: AC
  Administered 2019-08-09: 20 mmol via INTRAVENOUS
  Filled 2019-08-09: qty 6.67

## 2019-08-09 NOTE — Progress Notes (Signed)
PROGRESS NOTE    Gary Ramirez  ONG:[id number] DOB: [birth year] DOA: 08/07/2019   PCP: Patient, No Pcp Per   Brief Narrative:  Gary Ramirez a 44 y.o.malewith medical history significant ofGERD presents in the emergency department with nausea and vomiting for past 10 days.  History is obtained from sister at bedside who reports that he was having polyuria,  polydipsia and fatigue for last few months and he was about to establish a primary care physician.  Denies any fevers, URIs.  He went to urgent care and found to have a blood glucose of more than 600 and was sent to the ED.  In the ED he was found to be in DKA with a blood glucose of 943, acute kidney injury with a creatinine of 2.2, lactic acid of 3.4,  bicarb of 10. Patient was seen and examined at bedside.  He is awake alert and oriented.  He was admitted for high anion gap metabolic acidosis secondary to diabetic ketoacidosis.  Assessment & Plan:   Principal Problem:   DKA (diabetic ketoacidoses) ([place]) Active Problems:   AKI (acute kidney injury) ([place])   Diabetes mellitus, new onset ([place])  1. DKA - new onset DM - Resolved.  1. DKA pathway 2. Insulin gtt -discontinued, transitioned to subcu insulin after anion gap closed. 3. IVF: 2L bolus then 125 cc/hr NS then 100 cc/hr D5 half 4. BMP Q4H-electrolytes remains normal. Diabetic education completed, started on diabetic diet. Hemoglobin A1c 12.3  2. New onset DM - DM coordinator consult 3. AKI -improving 1. Presumably pre-renal due to N/V from DKA. 2. IVF as above. 3. Strict intake and output 4. Serial BMPs  DVT prophylaxis:Heparin [place] Code Status:Full Family Communication:Family at bedside Disposition Plan:Home after DKA resolved Consults called:None   Consultants:    Diabetes Educator  Procedures:  None.  Antimicrobials: .None   Subjective: Patient was seen and examined at bedside.  No overnight events.  He reports feeling much  better and more energetic.  Objective: Vitals:   08/09/19 0900 08/09/19 1000 08/09/19 1100 08/09/19 1200  BP: (!) 151/77 134/86 131/85 (!) 143/86  Pulse: 96 (!) 101 94 (!) 101  Resp: 16 17 13 15   Temp:    98.3 F (36.8 C)  TempSrc:    Oral  SpO2: 98% 99% 99% 99%  Weight:      Height:        Intake/Output Summary (Last 24 hours) at 08/09/2019 1340 Last data filed at 08/09/2019 1300 Gross per 24 hour  Intake 2078.09 ml  Output 1200 ml  Net 878.09 ml   Filed Weights   08/08/19 0037  Weight: 103.4 kg    Examination:  General exam: Appears calm and comfortable  Respiratory system: Clear to auscultation. Respiratory effort normal. Cardiovascular system: S1 & S2 heard, RRR. No JVD, murmurs, rubs, gallops or clicks. No pedal edema. Gastrointestinal system: Abdomen is nondistended, soft and nontender. No organomegaly or masses felt. Normal bowel sounds heard. Central nervous system: Alert and oriented. No focal neurological deficits. Extremities: Symmetric 5 x 5 power. Skin: No rashes, lesions or ulcers Psychiatry: Judgement and insight appear normal. Mood & affect appropriate.     Data Reviewed: I have personally reviewed following labs and imaging studies  CBC: Recent Labs  Lab 08/07/19 2230 08/09/19 0644  WBC 20.7* 13.5*  NEUTROABS 16.5*  --   HGB 17.3* 14.5  HCT 53.1* 42.2  MCV 87.3 83.4  PLT 326 440   Basic Metabolic Panel: Recent Labs  Lab 08/08/19 1721 08/08/19 2108 08/09/19 0136 08/09/19 0644 08/09/19 1027  NA 149* 149* 151* 149* 146*  K 4.1 3.5 4.0 3.7 3.5  CL 115* 118* 116* 116* 112*  CO2 16* 17* 17* 19* 22  GLUCOSE 255* 257* 234* 263* 271*  BUN 34* 31* 27* 25* 22*  CREATININE 1.61* 1.37* 1.31* 1.23 1.23  CALCIUM 8.6* 9.0 9.3 9.0 8.8*  MG  --   --   --  2.9*  --   PHOS  --   --   --  1.8*  --    GFR: Estimated Creatinine Clearance: 98.3 mL/min (by C-G formula based on SCr of 1.23 mg/dL). Liver Function Tests: Recent Labs  Lab 08/07/19 2230    AST 37  ALT 135*  ALKPHOS 115  BILITOT 2.3*  PROT 7.4  ALBUMIN 4.2   Recent Labs  Lab 08/07/19 2230  LIPASE 118*   No results for input(s): AMMONIA in the last 168 hours. Coagulation Profile: No results for input(s): INR, PROTIME in the last 168 hours. Cardiac Enzymes: No results for input(s): CKTOTAL, CKMB, CKMBINDEX, TROPONINI in the last 168 hours. BNP (last 3 results) No results for input(s): PROBNP in the last 8760 hours. HbA1C: Recent Labs    08/08/19 0139  HGBA1C 12.3*   CBG: Recent Labs  Lab 08/09/19 0906 08/09/19 1000 08/09/19 1057 08/09/19 1208 08/09/19 1301  GLUCAP 249* 213* 243* 241* 222*   Lipid Profile: No results for input(s): CHOL, HDL, LDLCALC, TRIG, CHOLHDL, LDLDIRECT in the last 72 hours. Thyroid Function Tests: No results for input(s): TSH, T4TOTAL, FREET4, T3FREE, THYROIDAB in the last 72 hours. Anemia Panel: No results for input(s): VITAMINB12, FOLATE, FERRITIN, TIBC, IRON, RETICCTPCT in the last 72 hours. Sepsis Labs: Recent Labs  Lab 08/07/19 2230 08/08/19 0139  LATICACIDVEN 3.4* 2.2*    Recent Results (from the past 240 hour(s))  SARS Coronavirus 2 by RT PCR (hospital order, performed in [facility] hospital lab) Nasopharyngeal Nasopharyngeal Swab     Status: None   Collection Time: 08/08/19 12:00 AM   Specimen: Nasopharyngeal Swab  Result Value Ref Range Status   SARS Coronavirus 2 NEGATIVE NEGATIVE Final    Comment: (NOTE) SARS-CoV-2 target nucleic acids are NOT DETECTED.  The SARS-CoV-2 RNA is generally detectable in upper and lower respiratory specimens during the acute phase of infection. The lowest concentration of SARS-CoV-2 viral copies this assay can detect is 250 copies / mL. A negative result does not preclude SARS-CoV-2 infection and should not be used as the sole basis for treatment or other patient management decisions.  A negative result may occur with improper specimen collection / handling, submission of  specimen other than nasopharyngeal swab, presence of viral mutation(s) within the areas targeted by this assay, and inadequate number of viral copies (<250 copies / mL). A negative result must be combined with clinical observations, patient history, and epidemiological information.  Fact Sheet for Patients:   StrictlyIdeas.no  Fact Sheet for Healthcare Providers: BankingDealers.co.za  This test is not yet approved or  cleared by the [place] FDA and has been authorized for detection and/or diagnosis of SARS-CoV-2 by FDA under an Emergency Use Authorization (EUA).  This EUA will remain in effect (meaning this test can be used) for the duration of the COVID-19 declaration under Section 564(b)(1) of the Act, 21 U.S.C. section 360bbb-3(b)(1), unless the authorization is terminated or revoked sooner.  Performed at [facility], [place] [employer] [address], [place], [place] [address]   Urine culture  Status: Abnormal   Collection Time: 08/08/19 12:30 AM   Specimen: Urine, Clean Catch  Result Value Ref Range Status   Specimen Description   Final    URINE, CLEAN CATCH Performed at [facility], [place] [address], [place], [place] [address]    Special Requests   Final    NONE Performed at [facility], [place] [address], [place], [place] [address]    Culture (A)  Final    <10,000 COLONIES/mL INSIGNIFICANT GROWTH Performed at [place] [address] [place], [place] [address]    Report Status 08/09/2019 FINAL  Final  MRSA PCR Screening     Status: Abnormal   Collection Time: 08/08/19  1:28 AM   Specimen: Nasal Mucosa; Nasopharyngeal  Result Value Ref Range Status   MRSA by PCR POSITIVE (A) NEGATIVE Final    Comment:        The GeneXpert MRSA Assay (FDA approved for NASAL specimens only), is one component of a comprehensive MRSA colonization surveillance program. It is  not intended to diagnose MRSA infection nor to guide or monitor treatment for MRSA infections. RESULT CALLED TO, READ BACK BY AND VERIFIED WITH: RN A WATSON AT 9741 08/08/19 CRUICKSHANK A Performed at [facility], [place] [address] [place], [place] [address]      Radiology Studies: No results found.  Scheduled Meds: . Chlorhexidine Gluconate Cloth  6 each Topical Daily  . heparin  5,000 Units Subcutaneous Q8H  . insulin aspart  2-6 Units Subcutaneous Q4H  . insulin detemir  26 Units Subcutaneous Q12H  . insulin starter kit- pen needles  1 kit Other Once  . living well with diabetes book   Does not apply Once  . mouth rinse  15 mL Mouth Rinse BID  . mupirocin ointment   Nasal BID   Continuous Infusions: . sodium chloride 125 mL/hr at 08/08/19 1632  . dextrose 5 % and 0.45% NaCl 100 mL/hr at 08/09/19 0800  . insulin 6 Units/hr (08/09/19 0909)  . potassium PHOSPHATE IVPB (in mmol) 20 mmol (08/09/19 1018)     LOS: 2 days    Time spent: 25 mins.    Shawna Clamp, MD [facility]   If 7PM-7AM, please contact night-coverage

## 2019-08-09 NOTE — Progress Notes (Signed)
CBG 369 and SSI only reaches 250. Provider made aware.Marland Kitchen awaiting insulin orders.

## 2019-08-09 NOTE — Progress Notes (Signed)
Inpatient Diabetes Program Recommendations  AACE/ADA: New Consensus Statement on Inpatient Glycemic Control (2015)  Target Ranges:  Prepandial:   less than 140 mg/dL      Peak postprandial:   less than 180 mg/dL (1-2 hours)      Critically ill patients:  140 - 180 mg/dL   Lab Results  Component Value Date   GLUCAP 241 (H) 08/09/2019   HGBA1C 12.3 (H) 08/08/2019    Review of Glycemic Control  Diabetes history: New-onset Outpatient Diabetes medications: None Current orders for Inpatient glycemic control: IV insulin transitioning to Levemir 26 units Q12H, Novolog 2-6 units Q4H  HgbA1C - 12.3%  Inpatient Diabetes Program Recommendations:     Will likely need meal coverage insulin - Novolog 8 units tidwc if eating > 50% meal.  Spoke with patient about new diabetes diagnosis.  Discussed A1C results (12.3%) and explained what an A1C is and informed patient that his current A1C indicates an average glucose of 306 mg/dl over the past 2-3 months. Discussed basic pathophysiology of DM Type 2, basic home care, importance of checking CBGs and maintaining good CBG control to prevent long-term and short-term complications. Reviewed glucose and A1C goals and explained that patient will need to continue to  Reviewed signs and symptoms of hyperglycemia and hypoglycemia along with treatment for both. Discussed impact of nutrition, exercise, stress, sickness, and medications on diabetes control. Reviewed Living Well with diabetes booklet and encouraged patient to read through entire book.   Educated patient and sister on insulin pen use at home. Reviewed contents of insulin flexpen starter kit. Reviewed all steps if insulin pen including attachment of needle, 2-unit air shot, dialing up dose, giving injection, removing needle, disposal of sharps, storage of unused insulin, disposal of insulin etc. Patient able to provide successful return demonstration. Also reviewed troubleshooting with insulin pen. MD to  give patient Rxs for insulin pens and insulin pen needles.  Pt seems very willing to make lifestyle changes to control his blood sugars. Has excellent attitude.   Will f/u in am.   Thank you. Lorenda Peck, RD, LDN, CDE Inpatient Diabetes Coordinator [phone]

## 2019-08-10 LAB — COMPREHENSIVE METABOLIC PANEL
ALT: 66 U/L — ABNORMAL HIGH (ref 0–44)
AST: 38 U/L (ref 15–41)
Albumin: 3.4 g/dL — ABNORMAL LOW (ref 3.5–5.0)
Alkaline Phosphatase: 65 U/L (ref 38–126)
Anion gap: 11 (ref 5–15)
BUN: 17 mg/dL (ref 6–20)
CO2: 22 mmol/L (ref 22–32)
Calcium: 8.5 mg/dL — ABNORMAL LOW (ref 8.9–10.3)
Chloride: 106 mmol/L (ref 98–111)
Creatinine, Ser: 0.98 mg/dL (ref 0.61–1.24)
GFR calc Af Amer: >60 mL/min (ref >60)
GFR calc non Af Amer: >60 mL/min (ref >60)
Glucose, Bld: 244 mg/dL — ABNORMAL HIGH (ref 70–99)
Potassium: 3.2 mmol/L — ABNORMAL LOW (ref 3.5–5.1)
Sodium: 139 mmol/L (ref 135–145)
Total Bilirubin: 1.5 mg/dL — ABNORMAL HIGH (ref 0.3–1.2)
Total Protein: 6 g/dL — ABNORMAL LOW (ref 6.5–8.1)

## 2019-08-10 LAB — GLUCOSE, CAPILLARY
Glucose-Capillary: 226 mg/dL — ABNORMAL HIGH (ref 70–99)
Glucose-Capillary: 188 mg/dL — ABNORMAL HIGH (ref 70–99)
Glucose-Capillary: 215 mg/dL — ABNORMAL HIGH (ref 70–99)
Glucose-Capillary: 211 mg/dL — ABNORMAL HIGH (ref 70–99)
Glucose-Capillary: 252 mg/dL — ABNORMAL HIGH (ref 70–99)

## 2019-08-10 LAB — CBC
HCT: 37 % — ABNORMAL LOW (ref 39.0–52.0)
Hemoglobin: 12.5 g/dL — ABNORMAL LOW (ref 13.0–17.0)
MCH: 28 pg (ref 26.0–34.0)
MCHC: 33.8 g/dL (ref 30.0–36.0)
MCV: 83 fL (ref 80.0–100.0)
Platelets: 144 K/uL — ABNORMAL LOW (ref 150–400)
RBC: 4.46 MIL/uL (ref 4.22–5.81)
RDW: 12.7 % (ref 11.5–15.5)
WBC: 8.2 K/uL (ref 4.0–10.5)
nRBC: 0 % (ref 0.0–0.2)

## 2019-08-10 LAB — MAGNESIUM: Magnesium: 2.4 mg/dL (ref 1.7–2.4)

## 2019-08-10 LAB — PHOSPHORUS: Phosphorus: 3.7 mg/dL (ref 2.5–4.6)

## 2019-08-10 MED ORDER — HYOSCYAMINE SULFATE 0.125 MG/5ML PO ELIX
0.2500 mg | ORAL_SOLUTION | Freq: Four times a day (QID) | ORAL | Status: DC | PRN
  Administered 2019-08-10: 0.25 mg via ORAL
  Filled 2019-08-10: qty 10
  Filled 2019-08-10: qty 2

## 2019-08-10 MED ORDER — POLYETHYLENE GLYCOL 3350 17 G PO PACK
17.0000 g | PACK | Freq: Every day | ORAL | Status: DC
  Administered 2019-08-10 – 2019-08-11 (×2): 17 g via ORAL
  Filled 2019-08-10 (×2): qty 1

## 2019-08-10 MED ORDER — DOCUSATE SODIUM 100 MG PO CAPS
100.0000 mg | ORAL_CAPSULE | Freq: Two times a day (BID) | ORAL | Status: DC | PRN
  Administered 2019-08-10: 100 mg via ORAL
  Filled 2019-08-10: qty 1

## 2019-08-10 MED ORDER — INSULIN ASPART 100 UNIT/ML ~~LOC~~ SOLN
0.0000 [IU] | SUBCUTANEOUS | Status: DC
  Administered 2019-08-10: 8 [IU] via SUBCUTANEOUS
  Administered 2019-08-11: 5 [IU] via SUBCUTANEOUS
  Administered 2019-08-11 (×2): 3 [IU] via SUBCUTANEOUS

## 2019-08-10 MED ORDER — SIMETHICONE 80 MG PO CHEW: 80.0000 mg | CHEWABLE_TABLET | Freq: Four times a day (QID) | ORAL | Status: DC | PRN

## 2019-08-10 MED ORDER — PROMETHAZINE HCL 25 MG/ML IJ SOLN
12.5000 mg | Freq: Four times a day (QID) | INTRAMUSCULAR | Status: DC | PRN
  Administered 2019-08-10: 12.5 mg via INTRAVENOUS
  Filled 2019-08-10: qty 1

## 2019-08-10 MED ORDER — FAMOTIDINE 20 MG PO TABS
20.0000 mg | ORAL_TABLET | Freq: Two times a day (BID) | ORAL | Status: DC
  Administered 2019-08-10 – 2019-08-11 (×2): 20 mg via ORAL
  Filled 2019-08-10 (×2): qty 1

## 2019-08-10 MED ORDER — POTASSIUM CHLORIDE 20 MEQ PO PACK
40.0000 meq | PACK | Freq: Once | ORAL | Status: AC
  Administered 2019-08-10: 40 meq via ORAL
  Filled 2019-08-10: qty 2

## 2019-08-10 MED ORDER — HYDRALAZINE HCL 20 MG/ML IJ SOLN
10.0000 mg | INTRAMUSCULAR | Status: DC | PRN
  Administered 2019-08-10: 10 mg via INTRAVENOUS

## 2019-08-10 MED ORDER — POTASSIUM CHLORIDE CRYS ER 20 MEQ PO TBCR: 30.0000 meq | EXTENDED_RELEASE_TABLET | Freq: Two times a day (BID) | ORAL | Status: DC

## 2019-08-10 MED ORDER — POTASSIUM CHLORIDE CRYS ER 20 MEQ PO TBCR
30.0000 meq | EXTENDED_RELEASE_TABLET | Freq: Two times a day (BID) | ORAL | Status: AC
  Administered 2019-08-10: 30 meq via ORAL
  Filled 2019-08-10: qty 1

## 2019-08-10 MED ORDER — HYDRALAZINE HCL 20 MG/ML IJ SOLN
INTRAMUSCULAR | Status: AC
  Filled 2019-08-10: qty 1

## 2019-08-10 NOTE — Progress Notes (Signed)
   08/10/19 0227  Oxygen Therapy  SpO2 (!) 78 %  O2 Device Room Air    Pt found sleeping with O2 in the 70's. As soon as patient awakened, O2 saturation 95-100%. Pt placed on 3LNC. Pt stated that he had been told in the past that he needed a sleep study for eval for OSA.

## 2019-08-10 NOTE — Progress Notes (Addendum)
PROGRESS NOTE    Gary Ramirez  LOV:[id number] DOB: Jun 17, 1975 DOA: 08/07/2019   PCP: Patient, No Pcp Per   Brief Narrative:  Gary Ramirez a 44 y.o.malewith medical history significant ofGERD presents in the emergency department with nausea and vomiting for past 10 days.  History is obtained from sister at bedside who reports that he was having polyuria,  polydipsia and fatigue for last few months and he was about to establish a primary care physician.  Denies any fevers, URIs.  He went to urgent care and found to have a blood glucose of more than 600 and was sent to the ED.  In the ED he was found to be in DKA with a blood glucose of 943, acute kidney injury with a creatinine of 2.2, lactic acid of 3.4,  bicarb of 10. He was admitted for high anion gap metabolic acidosis secondary to diabetic ketoacidosis.  Diabetic ketoacidosis has resolved, anion gap has closed, patient has been transitioned to subcu insulin, diabetic education has been completed.  Assessment & Plan:   Principal Problem:   DKA (diabetic ketoacidoses) ([place]) Active Problems:   AKI (acute kidney injury) ([place])   Diabetes mellitus, new onset ([place])  1. DKA - new onset DM - Resolved.  1. DKA pathway 2. Insulin gtt -discontinued, transitioned to subcu insulin after anion gap closed. 3. IVF: 2L bolus then 125 cc/hr NS then 100 cc/hr D5 half 4. BMP Q4H-electrolytes remains normal. Diabetic education completed, started on diabetic diet. Hemoglobin A1c 12.3. Plan: He can be discharged tomorrow on Levimir 26 units twice daily, NovoLog/ Humolog 8 units 3 times daily with meals Case manager is helping with insulin arrangement.glucometer and supplies.   2. New onset DM - DM coordinator consult 3. AKI - Resolved. 1. Presumably pre-renal due to N/V from DKA.. 2. IVF as above. 3. Strict intake and output 4. Serial BMPs  DVT prophylaxis:Heparin [place] Code Status:Full Family Communication:Family at  bedside Disposition Plan:Home  Tomorrow. Consults called:None   Consultants:    Diabetes Educator  Procedures:  None.  Antimicrobials: .None   Subjective: Patient was seen and examined at bedside.  No overnight events.  Patient has thrown up twice in the morning,  given Zofran.  Complains of abdominal pain.  Objective: Vitals:   08/10/19 1000 08/10/19 1100 08/10/19 1200 08/10/19 1219  BP:   (!) 144/88   Pulse: 88 78 85   Resp:   13   Temp:    (!) 97.3 F (36.3 C)  TempSrc:    Oral  SpO2: 95% 98% 97%   Weight:      Height:        Intake/Output Summary (Last 24 hours) at 08/10/2019 1410 Last data filed at 08/10/2019 0656 Gross per 24 hour  Intake 2406.64 ml  Output 3000 ml  Net -593.36 ml   Filed Weights   08/08/19 0037  Weight: 103.4 kg    Examination:  General exam: Appears calm and comfortable  Respiratory system: Clear to auscultation. Respiratory effort normal. Cardiovascular system: S1 & S2 heard, RRR. No JVD, murmurs, rubs, gallops or clicks. No pedal edema. Gastrointestinal system: Abdomen is nondistended, soft and nontender. No organomegaly or masses felt. Normal bowel sounds heard. Central nervous system: Alert and oriented. No focal neurological deficits. Extremities: No edema, no swelling, no cyanosis. Skin: No rashes, lesions or ulcers Psychiatry: Judgement and insight appear normal. Mood & affect appropriate.     Data Reviewed: I have personally reviewed following labs and imaging studies  CBC: Recent Labs  Lab 08/07/19 2230 08/09/19 0644 08/10/19 0246  WBC 20.7* 13.5* 8.2  NEUTROABS 16.5*  --   --   HGB 17.3* 14.5 12.5*  HCT 53.1* 42.2 37.0*  MCV 87.3 83.4 83.0  PLT 326 199 476*   Basic Metabolic Panel: Recent Labs  Lab 08/08/19 2108 08/09/19 0136 08/09/19 0644 08/09/19 1027 08/10/19 0246  NA 149* 151* 149* 146* 139  K 3.5 4.0 3.7 3.5 3.2*  CL 118* 116* 116* 112* 106  CO2 17* 17* 19* 22 22  GLUCOSE 257* 234* 263* 271* 244*   BUN 31* 27* 25* 22* 17  CREATININE 1.37* 1.31* 1.23 1.23 0.98  CALCIUM 9.0 9.3 9.0 8.8* 8.5*  MG  --   --  2.9*  --  2.4  PHOS  --   --  1.8*  --  3.7   GFR: Estimated Creatinine Clearance: 123.4 mL/min (by C-G formula based on SCr of 0.98 mg/dL). Liver Function Tests: Recent Labs  Lab 08/07/19 2230 08/10/19 0246  AST 37 38  ALT 135* 66*  ALKPHOS 115 65  BILITOT 2.3* 1.5*  PROT 7.4 6.0*  ALBUMIN 4.2 3.4*   Recent Labs  Lab 08/07/19 2230  LIPASE 118*   No results for input(s): AMMONIA in the last 168 hours. Coagulation Profile: No results for input(s): INR, PROTIME in the last 168 hours. Cardiac Enzymes: No results for input(s): CKTOTAL, CKMB, CKMBINDEX, TROPONINI in the last 168 hours. BNP (last 3 results) No results for input(s): PROBNP in the last 8760 hours. HbA1C: Recent Labs    08/08/19 0139  HGBA1C 12.3*   CBG: Recent Labs  Lab 08/09/19 2018 08/09/19 2256 08/10/19 0317 08/10/19 0739 08/10/19 1203  GLUCAP 369* 366* 226* 188* 215*   Lipid Profile: No results for input(s): CHOL, HDL, LDLCALC, TRIG, CHOLHDL, LDLDIRECT in the last 72 hours. Thyroid Function Tests: No results for input(s): TSH, T4TOTAL, FREET4, T3FREE, THYROIDAB in the last 72 hours. Anemia Panel: No results for input(s): VITAMINB12, FOLATE, FERRITIN, TIBC, IRON, RETICCTPCT in the last 72 hours. Sepsis Labs: Recent Labs  Lab 08/07/19 2230 08/08/19 0139  LATICACIDVEN 3.4* 2.2*    Recent Results (from the past 240 hour(s))  SARS Coronavirus 2 by RT PCR (hospital order, performed in [facility] hospital lab) Nasopharyngeal Nasopharyngeal Swab     Status: None   Collection Time: 08/08/19 12:00 AM   Specimen: Nasopharyngeal Swab  Result Value Ref Range Status   SARS Coronavirus 2 NEGATIVE NEGATIVE Final    Comment: (NOTE) SARS-CoV-2 target nucleic acids are NOT DETECTED.  The SARS-CoV-2 RNA is generally detectable in upper and lower respiratory specimens during the acute phase of  infection. The lowest concentration of SARS-CoV-2 viral copies this assay can detect is 250 copies / mL. A negative result does not preclude SARS-CoV-2 infection and should not be used as the sole basis for treatment or other patient management decisions.  A negative result may occur with improper specimen collection / handling, submission of specimen other than nasopharyngeal swab, presence of viral mutation(s) within the areas targeted by this assay, and inadequate number of viral copies (<250 copies / mL). A negative result must be combined with clinical observations, patient history, and epidemiological information.  Fact Sheet for Patients:   StrictlyIdeas.no  Fact Sheet for Healthcare Providers: BankingDealers.co.za  This test is not yet approved or  cleared by the [place] FDA and has been authorized for detection and/or diagnosis of SARS-CoV-2 by FDA under an Emergency Use Authorization (EUA).  This EUA will remain in effect (meaning this test can be used) for the duration of the COVID-19 declaration under Section 564(b)(1) of the Act, 21 U.S.C. section 360bbb-3(b)(1), unless the authorization is terminated or revoked sooner.  Performed at [facility], [place] [address], [place], [place] [address]   Urine culture     Status: Abnormal   Collection Time: 08/08/19 12:30 AM   Specimen: Urine, Clean Catch  Result Value Ref Range Status   Specimen Description   Final    URINE, CLEAN CATCH Performed at [facility], [place] [address], [place], [place] [address]    Special Requests   Final    NONE Performed at [facility], [place] [address], [place], [place] [address]    Culture (A)  Final    <10,000 COLONIES/mL INSIGNIFICANT GROWTH Performed at [place] [address], [place], [place] [address]    Report Status 08/09/2019 FINAL  Final  MRSA PCR Screening      Status: Abnormal   Collection Time: 08/08/19  1:28 AM   Specimen: Nasal Mucosa; Nasopharyngeal  Result Value Ref Range Status   MRSA by PCR POSITIVE (A) NEGATIVE Final    Comment:        The GeneXpert MRSA Assay (FDA approved for NASAL specimens only), is one component of a comprehensive MRSA colonization surveillance program. It is not intended to diagnose MRSA infection nor to guide or monitor treatment for MRSA infections. RESULT CALLED TO, READ BACK BY AND VERIFIED WITH: RN A WATSON AT 3976 08/08/19 CRUICKSHANK A Performed at [facility], [place] [address], [place], [place] [address]      Radiology Studies: No results found.  Scheduled Meds: . Chlorhexidine Gluconate Cloth  6 each Topical Daily  . heparin  5,000 Units Subcutaneous Q8H  . insulin aspart  2-6 Units Subcutaneous Q4H  . insulin detemir  26 Units Subcutaneous Q12H  . insulin starter kit- pen needles  1 kit Other Once  . living well with diabetes book   Does not apply Once  . mouth rinse  15 mL Mouth Rinse BID  . mupirocin ointment   Nasal BID  . polyethylene glycol  17 g Oral Daily  . potassium chloride  30 mEq Oral BID   Continuous Infusions: . sodium chloride 50 mL/hr at 08/09/19 2108     LOS: 3 days   Time spent: 25 mins.  Shawna Clamp, MD [facility]   If 7PM-7AM, please contact night-coverage

## 2019-08-10 NOTE — TOC Initial Note (Signed)
Transition of Care [facility]) - Initial/Assessment Note    Patient Details  Name: Gary Ramirez MRN: [id number] Date of Birth: [birth year]  [facility]) CM/SW Contact:    Leeroy Cha, RN Phone Number: 08/10/2019, 9:22 AM  Clinical Narrative:                 dka Plan from home has pcp, will have diabetic coordinator see and follow for toc plans.  Expected Discharge Plan: Home/Self Care Barriers to Discharge: Continued Medical Work up   Patient Goals and CMS Choice Patient states their goals for this hospitalization and ongoing recovery are:: to go home CMS Medicare.gov Compare Post Acute Care list provided to:: Patient    Expected Discharge Plan and Services Expected Discharge Plan: Home/Self Care   Discharge Planning Services: CM Consult   Living arrangements for the past 2 months: Single Family Home                                      Prior Living Arrangements/Services Living arrangements for the past 2 months: Single Family Home Lives with:: Self Patient language and need for interpreter reviewed:: Yes Do you feel safe going back to the place where you live?: Yes      Need for Family Participation in Patient Care: Yes (Comment) Care giver support system in place?: Yes (comment)   Criminal Activity/Legal Involvement Pertinent to Current Situation/Hospitalization: No - Comment as needed  Activities of Daily Living Home Assistive Devices/Equipment: Eyeglasses, Contact lenses ADL Screening (condition at time of admission) Patient's cognitive ability adequate to safely complete daily activities?: No Is the patient deaf or have difficulty hearing?: No Does the patient have difficulty seeing, even when wearing glasses/contacts?: No Does the patient have difficulty concentrating, remembering, or making decisions?: Yes Patient able to express need for assistance with ADLs?: Yes Does the patient have difficulty dressing or bathing?:  Yes Independently performs ADLs?: No Communication: Independent Dressing (OT): Needs assistance Is this a change from baseline?: Change from baseline, expected to last >3 days Grooming: Needs assistance Is this a change from baseline?: Change from baseline, expected to last >3 days Feeding: Needs assistance Is this a change from baseline?: Change from baseline, expected to last >3 days Bathing: Needs assistance Is this a change from baseline?: Change from baseline, expected to last >3 days Toileting: Needs assistance Is this a change from baseline?: Change from baseline, expected to last >3days In/Out Bed: Needs assistance Is this a change from baseline?: Change from baseline, expected to last >3 days Walks in Home: Needs assistance Is this a change from baseline?: Change from baseline, expected to last >3 days Does the patient have difficulty walking or climbing stairs?: Yes Weakness of Legs: Both Weakness of Arms/Hands: None  Permission Sought/Granted                  Emotional Assessment Appearance:: Appears stated age     Orientation: : Oriented to Self, Oriented to Place, Oriented to  Time, Oriented to Situation Alcohol / Substance Use: Not Applicable Psych Involvement: No (comment)  Admission diagnosis:  DKA (diabetic ketoacidoses) ([place]) [E11.10] Hyperglycemia [R73.9] Diabetic ketoacidosis without coma associated with diabetes mellitus due to underlying condition ([place]) [E08.10] Intractable vomiting with nausea, unspecified vomiting type [R11.2] Patient Active Problem List   Diagnosis Date Noted  . DKA (diabetic ketoacidoses) ([place]) 08/07/2019  . AKI (acute kidney injury) ([place]) 08/07/2019  .  Diabetes mellitus, new onset ([place]) 08/07/2019   PCP:  Patient, No Pcp Per Pharmacy:   [id number] - [place], [place]. AT [address] [place] [address] Phone: [phone] Fax:  [phone]     Social Determinants of Health (SDOH) Interventions    Readmission Risk Interventions No flowsheet data found.

## 2019-08-10 NOTE — Progress Notes (Signed)
Inpatient Diabetes Program Recommendations  AACE/ADA: New Consensus Statement on Inpatient Glycemic Control (2015)  Target Ranges:  Prepandial:   less than 140 mg/dL      Peak postprandial:   less than 180 mg/dL (1-2 hours)      Critically ill patients:  140 - 180 mg/dL   Lab Results  Component Value Date   GLUCAP 188 (H) 08/10/2019   HGBA1C 12.3 (H) 08/08/2019    Review of Glycemic Control  Blood sugars in 300s last night. 7/9 - 188 this am  Inpatient Diabetes Program Recommendations:     Add Novolog 8 units tidwc for meal coverage insulin. TOC consult for copay for detemir and Novolog/Humalog. OP Diabetes Education consult  Will follow-up for any questions or concerns this am.  Thank you. Lorenda Peck, RD, LDN, CDE Inpatient Diabetes Coordinator [phone]

## 2019-08-11 LAB — BASIC METABOLIC PANEL
Anion gap: 13 (ref 5–15)
BUN: 11 mg/dL (ref 6–20)
CO2: 24 mmol/L (ref 22–32)
Calcium: 8.7 mg/dL — ABNORMAL LOW (ref 8.9–10.3)
Chloride: 101 mmol/L (ref 98–111)
Creatinine, Ser: 0.92 mg/dL (ref 0.61–1.24)
GFR calc Af Amer: >60 mL/min (ref >60)
GFR calc non Af Amer: >60 mL/min (ref >60)
Glucose, Bld: 203 mg/dL — ABNORMAL HIGH (ref 70–99)
Potassium: 4 mmol/L (ref 3.5–5.1)
Sodium: 138 mmol/L (ref 135–145)

## 2019-08-11 LAB — GLUCOSE, CAPILLARY
Glucose-Capillary: 181 mg/dL — ABNORMAL HIGH (ref 70–99)
Glucose-Capillary: 184 mg/dL — ABNORMAL HIGH (ref 70–99)
Glucose-Capillary: 224 mg/dL — ABNORMAL HIGH (ref 70–99)

## 2019-08-11 MED ORDER — INSULIN STARTER KIT- PEN NEEDLES (ENGLISH): 1.0000 | Freq: Once | 0 refills | Status: AC

## 2019-08-11 MED ORDER — INSULIN ASPART 100 UNIT/ML FLEXPEN: 8.0000 [IU] | PEN_INJECTOR | Freq: Three times a day (TID) | SUBCUTANEOUS | 11 refills | Status: AC

## 2019-08-11 MED ORDER — INSULIN DETEMIR 100 UNIT/ML ~~LOC~~ SOLN: 26.0000 [IU] | Freq: Two times a day (BID) | SUBCUTANEOUS | 11 refills | Status: AC

## 2019-08-11 NOTE — Progress Notes (Signed)
Patient D/C'd home as ordered. D/C instructions explained to pt, mom and sister who all verbalized understanding. Pt stated he felt comfortable with checking his CBG and giving himself insulin. Pt aware of when to check CBG and when to take insulin (pt also advised not to take 8 units Novolog with meals if his CBG is less than 80 per hospital policy). Pt aware of where to pick up rx and prices of each, provided by CM. Assisted to exit via W/C with assist from nursing staff.

## 2019-08-11 NOTE — Discharge Summary (Signed)
Physician Discharge Summary  Gary Ramirez TIR:[id number] DOB: 1975/09/03 DOA: 08/07/2019  PCP: Patient, No Pcp Per  Admit date: 08/07/2019 Discharge date: 08/11/2019  Admitted From: Inpatient Disposition: home  Recommendations for Outpatient Follow-up:  1. Follow up with PCP scheduled for Monday AM 0830 2. Please obtain BMP/CBC in one week   Home Health:No Equipment/Devices:none  Discharge Condition:Stable CODE STATUS:Full code Diet recommendation: Diabetic diet  Brief/Interim Summary: Brief Narrative:  Gary Ramirez a 44 y.o.malewith medical history significant ofGERDpresents in the emergency department with nausea and vomiting for past 10 days.History is obtained from sister at bedside who reports that he was having polyuria,polydipsia and fatigue for last few months and he was about to establish a primary care physician. Denies any fevers,URIs. He went to urgent care and found to have a blood glucose of more than 600 and was sent to the ED. In the ED he was found to be in DKA with a blood glucose of 943,acute kidney injury with a creatinine of 2.2,lactic acid of 3.4,bicarb of 10. He was admitted for high anion gap metabolic acidosis secondary to diabetic ketoacidosis.  Diabetic ketoacidosis has resolved, anion gap has closed, patient has been transitioned to subcu insulin, diabetic education has been completed   Hospital course: Diabetic ketoacidosis New onset diabetes Acute kidney injury  Patient was admitted on the DKA pathway.  Was placed on insulin drip, he received appropriate fluid resuscitation with titration of the insulin drip and management of electrolyte disarray.  His hemoglobin A1c was noted to be 12.3..  He was started on Levemir 26 units twice daily as well as NovoLog 8 units 3 times daily with meals.  Patient received diabetic counseling.  His acute kidney injury resolved with hydration as above.  He is discharged on Saturday, electronic  prescriptions sent [facility], patient is to follow-up with his PCP on Monday at 08 30 appointment already scheduled.  Case management will discuss co-pays regarding insulin with the patient prior to discharge today.  I confirmed this in conversation with case management.  Patient stable condition understands plan of care and is in agreement with discharge.  Discharge Diagnoses:  Principal Problem:   DKA (diabetic ketoacidoses) ([place]) Active Problems:   AKI (acute kidney injury) ([place])   Diabetes mellitus, new onset [facility])    Discharge Instructions  Discharge Instructions    Call MD for:   Complete by: As directed    For any acute change in medical condition to include but not limited to elevated blood glucose, cp, n/v, sob   Diet Carb Modified   Complete by: As directed    Increase activity slowly   Complete by: As directed      Allergies as of 08/11/2019   No Known Allergies     Medication List    TAKE these medications   FLUoxetine 40 MG capsule Commonly known as: PROZAC Take 40 mg by mouth at bedtime.   insulin aspart 100 UNIT/ML FlexPen Commonly known as: NOVOLOG Inject 8 Units into the skin 3 (three) times daily with meals.   insulin detemir 100 UNIT/ML injection Commonly known as: LEVEMIR Inject 0.26 mLs (26 Units total) into the skin every 12 (twelve) hours.   insulin starter kit- pen needles Misc 1 kit by Other route once for 1 dose.   ondansetron 4 MG disintegrating tablet Commonly known as: ZOFRAN-ODT Take 4 mg by mouth every 8 (eight) hours as needed for nausea/vomiting.   pantoprazole 40 MG tablet Commonly known as: PROTONIX Take  1 tablet by mouth daily.       No Known Allergies  Consultations:  Diabetic case manager   Procedures/Studies:  No results found.    Subjective: Stable no acute events  Discharge Exam: Vitals:   08/11/19 0013 08/11/19 0418  BP: (!) 148/86 (!) 145/94  Pulse: 88 97  Resp: 20 20  Temp: 98.9 F (37.2 C) 98.9  F (37.2 C)  SpO2: 94% 93%   Vitals:   08/10/19 1855 08/10/19 2034 08/11/19 0013 08/11/19 0418  BP: (!) 141/60 137/86 (!) 148/86 (!) 145/94  Pulse: (!) 116 (!) 109 88 97  Resp: 20 20 20 20   Temp: (!) 97.3 F (36.3 C) 98 F (36.7 C) 98.9 F (37.2 C) 98.9 F (37.2 C)  TempSrc: Oral Oral Oral Oral  SpO2: 95% 94% 94% 93%  Weight:      Height:        General: Pt is alert, awake, not in acute distress Cardiovascular: RRR, S1/S2 +, no rubs, no gallops Respiratory: CTA bilaterally, no wheezing, no rhonchi Abdominal: Soft, NT, ND, bowel sounds + Extremities: no edema, no cyanosis    The results of significant diagnostics from this hospitalization (including imaging, microbiology, ancillary and laboratory) are listed below for reference.     Microbiology: Recent Results (from the past 240 hour(s))  SARS Coronavirus 2 by RT PCR (hospital order, performed in [facility] hospital lab) Nasopharyngeal Nasopharyngeal Swab     Status: None   Collection Time: 08/08/19 12:00 AM   Specimen: Nasopharyngeal Swab  Result Value Ref Range Status   SARS Coronavirus 2 NEGATIVE NEGATIVE Final    Comment: (NOTE) SARS-CoV-2 target nucleic acids are NOT DETECTED.  The SARS-CoV-2 RNA is generally detectable in upper and lower respiratory specimens during the acute phase of infection. The lowest concentration of SARS-CoV-2 viral copies this assay can detect is 250 copies / mL. A negative result does not preclude SARS-CoV-2 infection and should not be used as the sole basis for treatment or other patient management decisions.  A negative result may occur with improper specimen collection / handling, submission of specimen other than nasopharyngeal swab, presence of viral mutation(s) within the areas targeted by this assay, and inadequate number of viral copies (<250 copies / mL). A negative result must be combined with clinical observations, patient history, and epidemiological information.  Fact  Sheet for Patients:   StrictlyIdeas.no  Fact Sheet for Healthcare Providers: BankingDealers.co.za  This test is not yet approved or  cleared by the [place] FDA and has been authorized for detection and/or diagnosis of SARS-CoV-2 by FDA under an Emergency Use Authorization (EUA).  This EUA will remain in effect (meaning this test can be used) for the duration of the COVID-19 declaration under Section 564(b)(1) of the Act, 21 U.S.C. section 360bbb-3(b)(1), unless the authorization is terminated or revoked sooner.  Performed at [facility], [place] [address] [place], [place] [address]   Urine culture     Status: Abnormal   Collection Time: 08/08/19 12:30 AM   Specimen: Urine, Clean Catch  Result Value Ref Range Status   Specimen Description   Final    URINE, CLEAN CATCH Performed at [facility], [place] [address], [place], [place] [address]    Special Requests   Final    NONE Performed at [facility], [place] [address] [place], [place] [address]    Culture (A)  Final    <10,000 COLONIES/mL INSIGNIFICANT GROWTH Performed at [facility], [place] [address], [place], [place] [address]    Report Status 08/09/2019 FINAL  Final  MRSA PCR Screening     Status: Abnormal   Collection Time: 08/08/19  1:28 AM   Specimen: Nasal Mucosa; Nasopharyngeal  Result Value Ref Range Status   MRSA by PCR POSITIVE (A) NEGATIVE Final    Comment:        The GeneXpert MRSA Assay (FDA approved for NASAL specimens only), is one component of a comprehensive MRSA colonization surveillance program. It is not intended to diagnose MRSA infection nor to guide or monitor treatment for MRSA infections. RESULT CALLED TO, READ BACK BY AND VERIFIED WITH: RN A WATSON AT 3300 08/08/19 CRUICKSHANK A Performed at [facility], [place] [address], [place],  [address]      Labs: BNP  (last 3 results) No results for input(s): BNP in the last 8760 hours. Basic Metabolic Panel: Recent Labs  Lab 08/09/19 0136 08/09/19 0644 08/09/19 1027 08/10/19 0246 08/11/19 0416  NA 151* 149* 146* 139 138  K 4.0 3.7 3.5 3.2* 4.0  CL 116* 116* 112* 106 101  CO2 17* 19* 22 22 24   GLUCOSE 234* 263* 271* 244* 203*  BUN 27* 25* 22* 17 11  CREATININE 1.31* 1.23 1.23 0.98 0.92  CALCIUM 9.3 9.0 8.8* 8.5* 8.7*  MG  --  2.9*  --  2.4  --   PHOS  --  1.8*  --  3.7  --    Liver Function Tests: Recent Labs  Lab 08/07/19 2230 08/10/19 0246  AST 37 38  ALT 135* 66*  ALKPHOS 115 65  BILITOT 2.3* 1.5*  PROT 7.4 6.0*  ALBUMIN 4.2 3.4*   Recent Labs  Lab 08/07/19 2230  LIPASE 118*   No results for input(s): AMMONIA in the last 168 hours. CBC: Recent Labs  Lab 08/07/19 2230 08/09/19 0644 08/10/19 0246  WBC 20.7* 13.5* 8.2  NEUTROABS 16.5*  --   --   HGB 17.3* 14.5 12.5*  HCT 53.1* 42.2 37.0*  MCV 87.3 83.4 83.0  PLT 326 199 144*   Cardiac Enzymes: No results for input(s): CKTOTAL, CKMB, CKMBINDEX, TROPONINI in the last 168 hours. BNP: Invalid input(s): POCBNP CBG: Recent Labs  Lab 08/10/19 1618 08/10/19 2035 08/11/19 0009 08/11/19 0416 08/11/19 0749  GLUCAP 211* 252* 224* 184* 181*   D-Dimer No results for input(s): DDIMER in the last 72 hours. Hgb A1c No results for input(s): HGBA1C in the last 72 hours. Lipid Profile No results for input(s): CHOL, HDL, LDLCALC, TRIG, CHOLHDL, LDLDIRECT in the last 72 hours. Thyroid function studies No results for input(s): TSH, T4TOTAL, T3FREE, THYROIDAB in the last 72 hours.  Invalid input(s): FREET3 Anemia work up No results for input(s): VITAMINB12, FOLATE, FERRITIN, TIBC, IRON, RETICCTPCT in the last 72 hours. Urinalysis    Component Value Date/Time   COLORURINE YELLOW 08/08/2019 0030   APPEARANCEUR CLEAR 08/08/2019 0030   LABSPEC 1.025 08/08/2019 0030   PHURINE 5.0 08/08/2019 0030   GLUCOSEU >=500 (A)  08/08/2019 0030   HGBUR MODERATE (A) 08/08/2019 0030   BILIRUBINUR NEGATIVE 08/08/2019 0030   KETONESUR 80 (A) 08/08/2019 0030   PROTEINUR NEGATIVE 08/08/2019 0030   NITRITE NEGATIVE 08/08/2019 0030   LEUKOCYTESUR NEGATIVE 08/08/2019 0030   Sepsis Labs Invalid input(s): PROCALCITONIN,  WBC,  LACTICIDVEN Microbiology Recent Results (from the past 240 hour(s))  SARS Coronavirus 2 by RT PCR (hospital order, performed in [facility] hospital lab) Nasopharyngeal Nasopharyngeal Swab  Status: None   Collection Time: 08/08/19 12:00 AM   Specimen: Nasopharyngeal Swab  Result Value Ref Range Status   SARS Coronavirus 2 NEGATIVE NEGATIVE Final    Comment: (NOTE) SARS-CoV-2 target nucleic acids are NOT DETECTED.  The SARS-CoV-2 RNA is generally detectable in upper and lower respiratory specimens during the acute phase of infection. The lowest concentration of SARS-CoV-2 viral copies this assay can detect is 250 copies / mL. A negative result does not preclude SARS-CoV-2 infection and should not be used as the sole basis for treatment or other patient management decisions.  A negative result may occur with improper specimen collection / handling, submission of specimen other than nasopharyngeal swab, presence of viral mutation(s) within the areas targeted by this assay, and inadequate number of viral copies (<250 copies / mL). A negative result must be combined with clinical observations, patient history, and epidemiological information.  Fact Sheet for Patients:   StrictlyIdeas.no  Fact Sheet for Healthcare Providers: BankingDealers.co.za  This test is not yet approved or  cleared by the [place] FDA and has been authorized for detection and/or diagnosis of SARS-CoV-2 by FDA under an Emergency Use Authorization (EUA).  This EUA will remain in effect (meaning this test can be used) for the duration of the COVID-19 declaration under  Section 564(b)(1) of the Act, 21 U.S.C. section 360bbb-3(b)(1), unless the authorization is terminated or revoked sooner.  Performed at [facility], [place] [address], [place], [place] [address]   Urine culture     Status: Abnormal   Collection Time: 08/08/19 12:30 AM   Specimen: Urine, Clean Catch  Result Value Ref Range Status   Specimen Description   Final    URINE, CLEAN CATCH Performed at [facility], [place] [address] [place], [place], [place] [address]    Special Requests   Final    NONE Performed at [facility], [place] [address], [place], [place] [address]    Culture (A)  Final    <10,000 COLONIES/mL INSIGNIFICANT GROWTH Performed at [place] [address], [place], [place] [address]    Report Status 08/09/2019 FINAL  Final  MRSA PCR Screening     Status: Abnormal   Collection Time: 08/08/19  1:28 AM   Specimen: Nasal Mucosa; Nasopharyngeal  Result Value Ref Range Status   MRSA by PCR POSITIVE (A) NEGATIVE Final    Comment:        The GeneXpert MRSA Assay (FDA approved for NASAL specimens only), is one component of a comprehensive MRSA colonization surveillance program. It is not intended to diagnose MRSA infection nor to guide or monitor treatment for MRSA infections. RESULT CALLED TO, READ BACK BY AND VERIFIED WITH: RN A WATSON AT 4239 08/08/19 CRUICKSHANK A Performed at [facility], [place] [address] [place],  [address]      Time coordinating discharge: Over 30 minutes  SIGNED:   Nicolette Bang, MD  [facility] 08/11/2019, 10:25 AM Pager   If 7PM-7AM, please contact night-coverage [email or web address] Password [email or web address]

## 2019-08-11 NOTE — TOC Progression Note (Signed)
Transition of Care [facility]) - Progression Note    Patient Details  Name: Gary Ramirez MRN: [id number] Date of Birth: 10-May-1975  Transition of Care [facility]) CM/SW Contact  Joaquin Courts, RN Phone Number: 08/11/2019, 11:40 AM  Clinical Narrative:    CM spoke with patient's pharmacy and was able to confirm out of pocket cost for Novolog is 60$ and 20$ for Levemir.  Of note this patient is insured and doe snot qualify for any existing patient assistance programs.    Expected Discharge Plan: Home/Self Care Barriers to Discharge: Continued Medical Work up  Expected Discharge Plan and Services Expected Discharge Plan: Home/Self Care   Discharge Planning Services: CM Consult   Living arrangements for the past 2 months: Single Family Home Expected Discharge Date: 08/11/19                                     Social Determinants of Health (SDOH) Interventions    Readmission Risk Interventions No flowsheet data found.

## 2019-08-11 NOTE — Progress Notes (Signed)
Per patient, he has not had a BM in at least a week. Pt very uncomfortable d/t cramping. Requesting "the big guns." MD Spongberg made aware. Will continue to monitor.

## 2019-08-13 ENCOUNTER — Encounter (HOSPITAL_COMMUNITY): Payer: Self-pay

## 2019-08-13 ENCOUNTER — Inpatient Hospital Stay (HOSPITAL_COMMUNITY)
Admission: EM | Admit: 2019-08-13 | Discharge: 2019-08-15 | DRG: 419 | Disposition: A | Discharge status: Home / Self Care | Payer: BLUE CROSS/BLUE SHIELD | Attending: General Surgery | Admitting: General Surgery

## 2019-08-13 ENCOUNTER — Other Ambulatory Visit: Payer: Self-pay

## 2019-08-13 ENCOUNTER — Emergency Department (HOSPITAL_COMMUNITY): Payer: BLUE CROSS/BLUE SHIELD

## 2019-08-13 DIAGNOSIS — R5381 Other malaise: Secondary | ICD-10-CM | POA: Diagnosis not present

## 2019-08-13 DIAGNOSIS — R1011 Right upper quadrant pain: Secondary | ICD-10-CM

## 2019-08-13 DIAGNOSIS — R0902 Hypoxemia: Secondary | ICD-10-CM | POA: Diagnosis not present

## 2019-08-13 DIAGNOSIS — K219 Gastro-esophageal reflux disease without esophagitis: Secondary | ICD-10-CM | POA: Diagnosis present

## 2019-08-13 DIAGNOSIS — K8000 Calculus of gallbladder with acute cholecystitis without obstruction: Secondary | ICD-10-CM | POA: Diagnosis not present

## 2019-08-13 DIAGNOSIS — Z20822 Contact with and (suspected) exposure to covid-19: Secondary | ICD-10-CM | POA: Diagnosis not present

## 2019-08-13 DIAGNOSIS — R000 Tachycardia, unspecified: Secondary | ICD-10-CM | POA: Diagnosis not present

## 2019-08-13 DIAGNOSIS — F17200 Nicotine dependence, unspecified, uncomplicated: Secondary | ICD-10-CM | POA: Diagnosis not present

## 2019-08-13 DIAGNOSIS — K82A1 Gangrene of gallbladder in cholecystitis: Secondary | ICD-10-CM | POA: Diagnosis present

## 2019-08-13 DIAGNOSIS — K819 Cholecystitis, unspecified: Secondary | ICD-10-CM | POA: Diagnosis not present

## 2019-08-13 DIAGNOSIS — Z03818 Encounter for observation for suspected exposure to other biological agents ruled out: Secondary | ICD-10-CM | POA: Diagnosis not present

## 2019-08-13 DIAGNOSIS — E1110 Type 2 diabetes mellitus with ketoacidosis without coma: Secondary | ICD-10-CM | POA: Diagnosis not present

## 2019-08-13 DIAGNOSIS — K760 Fatty (change of) liver, not elsewhere classified: Secondary | ICD-10-CM | POA: Diagnosis not present

## 2019-08-13 DIAGNOSIS — Z794 Long term (current) use of insulin: Secondary | ICD-10-CM

## 2019-08-13 DIAGNOSIS — R1084 Generalized abdominal pain: Secondary | ICD-10-CM | POA: Diagnosis not present

## 2019-08-13 DIAGNOSIS — E119 Type 2 diabetes mellitus without complications: Secondary | ICD-10-CM | POA: Diagnosis not present

## 2019-08-13 DIAGNOSIS — Z79899 Other long term (current) drug therapy: Secondary | ICD-10-CM | POA: Diagnosis not present

## 2019-08-13 DIAGNOSIS — K8020 Calculus of gallbladder without cholecystitis without obstruction: Secondary | ICD-10-CM | POA: Diagnosis not present

## 2019-08-13 LAB — COMPREHENSIVE METABOLIC PANEL
ALT: 44 U/L (ref 0–44)
AST: 29 U/L (ref 15–41)
Albumin: 2.5 g/dL — ABNORMAL LOW (ref 3.5–5.0)
Alkaline Phosphatase: 122 U/L (ref 38–126)
Anion gap: 12 (ref 5–15)
BUN: 7 mg/dL (ref 6–20)
CO2: 26 mmol/L (ref 22–32)
Calcium: 8.6 mg/dL — ABNORMAL LOW (ref 8.9–10.3)
Chloride: 96 mmol/L — ABNORMAL LOW (ref 98–111)
Creatinine, Ser: 0.8 mg/dL (ref 0.61–1.24)
GFR calc Af Amer: >60 mL/min (ref >60)
GFR calc non Af Amer: >60 mL/min (ref >60)
Glucose, Bld: 142 mg/dL — ABNORMAL HIGH (ref 70–99)
Potassium: 3.4 mmol/L — ABNORMAL LOW (ref 3.5–5.1)
Sodium: 134 mmol/L — ABNORMAL LOW (ref 135–145)
Total Bilirubin: 1.1 mg/dL (ref 0.3–1.2)
Total Protein: 6.1 g/dL — ABNORMAL LOW (ref 6.5–8.1)

## 2019-08-13 LAB — CBC WITH DIFFERENTIAL/PLATELET
Abs Immature Granulocytes: 1.07 10*3/uL — ABNORMAL HIGH (ref 0.00–0.07)
Basophils Absolute: 0.1 10*3/uL (ref 0.0–0.1)
Basophils Relative: 0 %
Eosinophils Absolute: 0.2 10*3/uL (ref 0.0–0.5)
Eosinophils Relative: 1 %
HCT: 38.4 % — ABNORMAL LOW (ref 39.0–52.0)
Hemoglobin: 13 g/dL (ref 13.0–17.0)
Immature Granulocytes: 8 %
Lymphocytes Relative: 10 %
Lymphs Abs: 1.4 10*3/uL (ref 0.7–4.0)
MCH: 28.1 pg (ref 26.0–34.0)
MCHC: 33.9 g/dL (ref 30.0–36.0)
MCV: 82.9 fL (ref 80.0–100.0)
Monocytes Absolute: 1.5 10*3/uL — ABNORMAL HIGH (ref 0.1–1.0)
Monocytes Relative: 11 %
Neutro Abs: 9.4 10*3/uL — ABNORMAL HIGH (ref 1.7–7.7)
Neutrophils Relative %: 70 %
Platelets: 200 10*3/uL (ref 150–400)
RBC: 4.63 MIL/uL (ref 4.22–5.81)
RDW: 12.4 % (ref 11.5–15.5)
WBC: 13.6 10*3/uL — ABNORMAL HIGH (ref 4.0–10.5)
nRBC: 0.1 % (ref 0.0–0.2)

## 2019-08-13 LAB — URINALYSIS, ROUTINE W REFLEX MICROSCOPIC
Bacteria, UA: NONE SEEN
Bilirubin Urine: NEGATIVE
Glucose, UA: 50 mg/dL — AB
Ketones, ur: 5 mg/dL — AB
Leukocytes,Ua: NEGATIVE
Nitrite: NEGATIVE
Protein, ur: 30 mg/dL — AB
Specific Gravity, Urine: 1.005 (ref 1.005–1.030)
pH: 7 (ref 5.0–8.0)

## 2019-08-13 LAB — CBG MONITORING, ED: Glucose-Capillary: 113 mg/dL — ABNORMAL HIGH (ref 70–99)

## 2019-08-13 LAB — SARS CORONAVIRUS 2 BY RT PCR (HOSPITAL ORDER, PERFORMED IN ~~LOC~~ HOSPITAL LAB): SARS Coronavirus 2: NEGATIVE

## 2019-08-13 LAB — LIPASE, BLOOD: Lipase: 94 U/L — ABNORMAL HIGH (ref 11–51)

## 2019-08-13 MED ORDER — SODIUM CHLORIDE 0.9 % IV SOLN
2.0000 g | INTRAVENOUS | Status: DC
  Administered 2019-08-14: 2 g via INTRAVENOUS
  Filled 2019-08-13: qty 2

## 2019-08-13 MED ORDER — ACETAMINOPHEN 650 MG RE SUPP: 650.0000 mg | Freq: Four times a day (QID) | RECTAL | Status: DC | PRN

## 2019-08-13 MED ORDER — SODIUM CHLORIDE 0.9 % IV SOLN
2.0000 g | Freq: Once | INTRAVENOUS | Status: AC
  Administered 2019-08-13: 2 g via INTRAVENOUS
  Filled 2019-08-13: qty 20

## 2019-08-13 MED ORDER — HYDROMORPHONE HCL 1 MG/ML IJ SOLN
0.5000 mg | Freq: Once | INTRAMUSCULAR | Status: AC
  Administered 2019-08-13: 0.5 mg via INTRAVENOUS
  Filled 2019-08-13: qty 1

## 2019-08-13 MED ORDER — OXYCODONE HCL 5 MG PO TABS
5.0000 mg | ORAL_TABLET | ORAL | Status: DC | PRN
  Administered 2019-08-13 – 2019-08-15 (×3): 10 mg via ORAL
  Filled 2019-08-13 (×3): qty 2

## 2019-08-13 MED ORDER — ACETAMINOPHEN 325 MG PO TABS: 650.0000 mg | ORAL_TABLET | Freq: Four times a day (QID) | ORAL | Status: DC | PRN

## 2019-08-13 MED ORDER — INSULIN ASPART 100 UNIT/ML ~~LOC~~ SOLN
0.0000 [IU] | SUBCUTANEOUS | Status: DC
  Administered 2019-08-14: 3 [IU] via SUBCUTANEOUS
  Administered 2019-08-14: 5 [IU] via SUBCUTANEOUS
  Administered 2019-08-14 (×2): 3 [IU] via SUBCUTANEOUS
  Administered 2019-08-14: 8 [IU] via SUBCUTANEOUS
  Administered 2019-08-14: 3 [IU] via SUBCUTANEOUS
  Administered 2019-08-15 (×2): 5 [IU] via SUBCUTANEOUS
  Filled 2019-08-13: qty 0.15

## 2019-08-13 MED ORDER — ONDANSETRON 4 MG PO TBDP: 4.0000 mg | ORAL_TABLET | Freq: Four times a day (QID) | ORAL | Status: DC | PRN

## 2019-08-13 MED ORDER — HYDROMORPHONE HCL 1 MG/ML IJ SOLN
1.0000 mg | INTRAMUSCULAR | Status: DC | PRN
  Administered 2019-08-14 (×3): 1 mg via INTRAVENOUS
  Filled 2019-08-13 (×3): qty 1

## 2019-08-13 MED ORDER — ONDANSETRON HCL 4 MG/2ML IJ SOLN: 4.0000 mg | Freq: Four times a day (QID) | INTRAMUSCULAR | Status: DC | PRN

## 2019-08-13 MED ORDER — SODIUM CHLORIDE 0.9% FLUSH: 3.0000 mL | Freq: Once | INTRAVENOUS | Status: DC

## 2019-08-13 MED ORDER — POTASSIUM CHLORIDE IN NACL 20-0.45 MEQ/L-% IV SOLN
INTRAVENOUS | Status: DC
  Filled 2019-08-13 (×3): qty 1000

## 2019-08-13 NOTE — ED Notes (Signed)
Provided pt with a urinal. Pt said he used the bathroom before coming here. Pt has had a liter of fluid but says he does not have the urge to use the bathroom.  Pt thinks he may be able to use the bathroom if he can drink fluids.

## 2019-08-13 NOTE — ED Provider Notes (Signed)
[facility] DEPT Provider Note   CSN: [id number] Arrival date & time: 08/13/19  1803     History Chief Complaint  Patient presents with  . Abdominal Pain    Gary Ramirez is a 44 y.o. male.  HPI   Patient is a 44 year old male with a medical history as noted below.  Patient was recently admitted for DKA.  While still admitted he began experiencing right upper quadrant pain that started about 3 days ago.  He was constipated at the time and thought it was secondary to constipation so did not think much of it.  After being discharged he continued to experience worsening right upper quadrant pain.  He went to his PCP this morning and was scheduled for an outpatient ultrasound this afternoon.  Patient was told "he had gallstones" and was given a referral to general surgery tomorrow.  He was told if his symptoms worsen to return to the emergency department.  He states his symptoms have continued to worsen and he is having 10/10 pain in the prior mentioned region.  He reports associated decreased appetite.  He denies fevers, chills, nausea, vomiting, diarrhea, urinary changes, syncope, CP, SOB.     Past Medical History:  Diagnosis Date  . GERD (gastroesophageal reflux disease)     Patient Active Problem List   Diagnosis Date Noted  . DKA (diabetic ketoacidoses) ([place]) 08/07/2019  . AKI (acute kidney injury) ([place]) 08/07/2019  . Diabetes mellitus, new onset ([place]) 08/07/2019    History reviewed. No pertinent surgical history.     History reviewed. No pertinent family history.  Social History   Tobacco Use  . Smoking status: Current Every Day Smoker  . Smokeless tobacco: Never Used  Vaping Use  . Vaping Use: Never used  Substance Use Topics  . Alcohol use: Yes  . Drug use: Never    Home Medications Prior to Admission medications   Medication Sig Start Date End Date Taking? Authorizing Provider  FLUoxetine (PROZAC) 40 MG capsule Take 40  mg by mouth at bedtime. 05/23/19   [provider]  insulin aspart (NOVOLOG) 100 UNIT/ML FlexPen Inject 8 Units into the skin 3 (three) times daily with meals. 08/11/19   Spongberg, Audie Pinto, MD  insulin detemir (LEVEMIR) 100 UNIT/ML injection Inject 0.26 mLs (26 Units total) into the skin every 12 (twelve) hours. 08/11/19   Spongberg, Audie Pinto, MD  ondansetron (ZOFRAN-ODT) 4 MG disintegrating tablet Take 4 mg by mouth every 8 (eight) hours as needed for nausea/vomiting. 08/07/19   [provider]  pantoprazole (PROTONIX) 40 MG tablet Take 1 tablet by mouth daily. 05/28/19   [provider]    Allergies    Patient has no known allergies.  Review of Systems   Review of Systems  All other systems reviewed and are negative. Ten systems reviewed and are negative for acute change, except as noted in the HPI.    Physical Exam Updated Vital Signs BP 122/73 (BP Location: Left Arm)   Pulse (!) 120   Temp 99.9 F (37.7 C) (Oral)   Resp 16   Ht 6\' 2"  (1.88 m)   Wt 102.1 kg   SpO2 95%   BMI 28.89 kg/m   Physical Exam Vitals and nursing note reviewed.  Constitutional:      General: He is in acute distress.     Appearance: Normal appearance. He is well-developed. He is not ill-appearing, toxic-appearing or diaphoretic.  HENT:     Head: Normocephalic and  atraumatic.     Right Ear: External ear normal.     Left Ear: External ear normal.     Nose: Nose normal.     Mouth/Throat:     Mouth: Mucous membranes are moist.     Pharynx: Oropharynx is clear. No oropharyngeal exudate or posterior oropharyngeal erythema.  Eyes:     Extraocular Movements: Extraocular movements intact.  Cardiovascular:     Rate and Rhythm: Regular rhythm. Tachycardia present.     Pulses: Normal pulses.     Heart sounds: Normal heart sounds. No murmur heard.  No friction rub. No gallop.      Comments: Tachycardic around 120 while I am in the room. Pulmonary:     Effort: Pulmonary  effort is normal. No respiratory distress.     Breath sounds: Normal breath sounds. No stridor. No wheezing, rhonchi or rales.  Abdominal:     General: Abdomen is flat and protuberant. There is no distension.     Palpations: Abdomen is soft.     Tenderness: There is abdominal tenderness in the right upper quadrant and epigastric area. There is no guarding or rebound. Positive signs include Murphy's sign. Negative signs include McBurney's sign.     Comments: Abdomen is soft.  Positive Murphy sign.  Mild TTP noted in the epigastric region.  Musculoskeletal:        General: Normal range of motion.     Cervical back: Normal range of motion and neck supple. No tenderness.  Skin:    General: Skin is warm and dry.  Neurological:     General: No focal deficit present.     Mental Status: He is alert and oriented to person, place, and time.  Psychiatric:        Mood and Affect: Mood normal.        Behavior: Behavior normal.    ED Results / Procedures / Treatments   Labs (all labs ordered are listed, but only abnormal results are displayed) Labs Reviewed  LIPASE, BLOOD - Abnormal; Notable for the following components:      Result Value   Lipase 94 (*)    All other components within normal limits  COMPREHENSIVE METABOLIC PANEL - Abnormal; Notable for the following components:   Sodium 134 (*)    Potassium 3.4 (*)    Chloride 96 (*)    Glucose, Bld 142 (*)    Calcium 8.6 (*)    Total Protein 6.1 (*)    Albumin 2.5 (*)    All other components within normal limits  URINALYSIS, ROUTINE W REFLEX MICROSCOPIC - Abnormal; Notable for the following components:   Glucose, UA 50 (*)    Hgb urine dipstick SMALL (*)    Ketones, ur 5 (*)    Protein, ur 30 (*)    All other components within normal limits  CBC WITH DIFFERENTIAL/PLATELET - Abnormal; Notable for the following components:   WBC 13.6 (*)    HCT 38.4 (*)    Neutro Abs 9.4 (*)    Monocytes Absolute 1.5 (*)    Abs Immature Granulocytes  1.07 (*)    All other components within normal limits  CBG MONITORING, ED - Abnormal; Notable for the following components:   Glucose-Capillary 113 (*)    All other components within normal limits  SARS CORONAVIRUS 2 BY RT PCR (HOSPITAL ORDER, [facility])  COMPREHENSIVE METABOLIC PANEL  CBC  LIPASE, BLOOD   EKG None  Radiology US Abdomen Limited RUQ  Result Date: 08/13/2019 CLINICAL DATA:  Right upper quadrant pain for 3 days. EXAM: ULTRASOUND ABDOMEN LIMITED RIGHT UPPER QUADRANT COMPARISON:  None. FINDINGS: Gallbladder: Distended with stones and sludge. Largest stone measures 1.2 cm. Asymmetric gallbladder wall thickening measuring up to 6 mm. There is trace pericholecystic fluid. No sonographic Murphy sign noted by sonographer. Common bile duct: Diameter: 4 mm. Liver: No focal lesion identified. Borderline increased in parenchymal echogenicity. Technically limited evaluation of the left hepatic lobe. Portal vein is patent on color Doppler imaging with normal direction of blood flow towards the liver. Other: Small amount of pericholecystic fluid is well as free fluid about the right lobe of the liver. IMPRESSION: 1. Sonographic findings suspicious for acute cholecystitis. Distended gallbladder with gallstones and sludge, mild wall thickening and pericholecystic fluid. However, sonographic Percell Miller sign is reportedly negative. 2. No biliary dilatation. 3. Borderline hepatic steatosis Electronically Signed   By: Keith Rake M.D.   On: 08/13/2019 19:53    Procedures Procedures (including critical care time)  Medications Ordered in ED Medications  sodium chloride flush (NS) 0.9 % injection 3 mL (3 mLs Intravenous Not Given 08/13/19 1848)  0.45 % NaCl with KCl 20 mEq / L infusion (has no administration in time range)  cefTRIAXone (ROCEPHIN) 2 g in sodium chloride 0.9 % 100 mL IVPB (has no administration in time range)  acetaminophen (TYLENOL) tablet 650 mg (has no  administration in time range)    Or  acetaminophen (TYLENOL) suppository 650 mg (has no administration in time range)  oxyCODONE (Oxy IR/ROXICODONE) immediate release tablet 5-10 mg (has no administration in time range)  HYDROmorphone (DILAUDID) injection 1 mg (has no administration in time range)  ondansetron (ZOFRAN-ODT) disintegrating tablet 4 mg (has no administration in time range)    Or  ondansetron (ZOFRAN) injection 4 mg (has no administration in time range)  insulin aspart (novoLOG) injection 0-15 Units (has no administration in time range)  HYDROmorphone (DILAUDID) injection 0.5 mg (0.5 mg Intravenous Given 08/13/19 1915)  cefTRIAXone (ROCEPHIN) 2 g in sodium chloride 0.9 % 100 mL IVPB (2 g Intravenous New Bag/Given 08/13/19 2055)    ED Course  I have reviewed the triage vital signs and the nursing notes.  Pertinent labs & imaging results that were available during my care of the patient were reviewed by me and considered in my medical decision making (see chart for details).  Clinical Course as of Aug 12 2145  Mon Aug 13, 2019  1948 Slightly decreased from prior  Lipase(!): 94 [LJ]  1948 WBC(!): 13.6 [LJ]  1948 NEUT#(!): 9.4 [LJ]  2021 Sonographic findings suspicious for acute cholecystitis. Distended gallbladder with gallstones and sludge, mild wall thickening and pericholecystic fluid. However, sonographic Percell Miller sign is reportedly negative.  US Abdomen Limited RUQ [LJ]  2054 Reevaluated patient and he states his pain is now 2/10 after being given Dilaudid.  Likely why patient did not have a sonographic Murphy sign.   [LJ]    Clinical Course User Index [LJ] Rayna Sexton, PA-C   MDM Rules/Calculators/A&P                          Pt is a 44 y.o. male that present with a history, physical exam, ED Clinical Course as noted above.   Patient presents with 3 days of worsening right upper quadrant pain.  Ultrasound obtained which is suspicious for acute cholecystitis.   Mild leukocytosis with mild neutrophilia.  My attending physician discussed this patient  with on-call general surgeon Dr. Harlow Asa.  Patient given IV Rocephin.  Pain controlled with Dilaudid.  Patient will be admitted for likely cholecystectomy.  COVID-19 test ordered.  Note: Portions of this report may have been transcribed using voice recognition software. Every effort was made to ensure accuracy; however, inadvertent computerized transcription errors may be present.   Final Clinical Impression(s) / ED Diagnoses Final diagnoses:  RUQ abdominal pain  Cholecystitis   Rx / DC Orders ED Discharge Orders    None       Rayna Sexton, PA-C 08/13/19 2150    Sherwood Gambler, MD 08/14/19 1528

## 2019-08-13 NOTE — ED Triage Notes (Signed)
Per EMs- patient c/o RUQ pain x 3 days. Patient states he was told he had gallstones and needed to come to the ED for a plan for surgery

## 2019-08-13 NOTE — H&P (Signed)
Gary Ramirez is an 44 y.o. male.    General Surgery [facility]  Chief Complaint: abdominal pain, nausea, emesis - acute cholecystitis, cholelithiasis  HPI: Patient is a 44 year old male who presents to the emergency department with 3-day history of abdominal pain localized to the epigastrium and right upper quadrant and radiating to the right shoulder.  This is associated with nausea and emesis.  Pain became more severe earlier today and he presented to the emergency department for evaluation.  Of note, the patient was recently admitted with diabetic ketoacidosis and newly diagnosed diabetes mellitus.  Glucose on admission today is 142.  Additional laboratory studies include an elevated white blood cell count of 13.6 and an elevated lipase of 94.  Liver function test are normal.  Ultrasound examination shows findings consistent with acute cholecystitis and cholelithiasis.  General surgery is called for evaluation and management.  Patient is accompanied by his sister.  He is currently laid off from [employer].  He has no prior history of abdominal surgery.  Past Medical History:  Diagnosis Date  . GERD (gastroesophageal reflux disease)     History reviewed. No pertinent surgical history.  History reviewed. No pertinent family history. Social History:  reports that he has been smoking. He has never used smokeless tobacco. He reports current alcohol use. He reports that he does not use drugs.  Allergies: No Known Allergies  (Not in a hospital admission)   Results for orders placed or performed during the hospital encounter of 08/13/19 (from the past 48 hour(s))  Lipase, blood     Status: Abnormal   Collection Time: 08/13/19  6:29 PM  Result Value Ref Range   Lipase 94 (H) 11 - 51 U/L    Comment: Performed at [facility], [place] [address], [place], [place] [address]  Comprehensive metabolic panel     Status: Abnormal   Collection Time: 08/13/19   6:29 PM  Result Value Ref Range   Sodium 134 (L) 135 - 145 mmol/L   Potassium 3.4 (L) 3.5 - 5.1 mmol/L   Chloride 96 (L) 98 - 111 mmol/L   CO2 26 22 - 32 mmol/L   Glucose, Bld 142 (H) 70 - 99 mg/dL    Comment: Glucose reference range applies only to samples taken after fasting for at least 8 hours.   BUN 7 6 - 20 mg/dL   Creatinine, Ser 0.80 0.61 - 1.24 mg/dL   Calcium 8.6 (L) 8.9 - 10.3 mg/dL   Total Protein 6.1 (L) 6.5 - 8.1 g/dL   Albumin 2.5 (L) 3.5 - 5.0 g/dL   AST 29 15 - 41 U/L   ALT 44 0 - 44 U/L   Alkaline Phosphatase 122 38 - 126 U/L   Total Bilirubin 1.1 0.3 - 1.2 mg/dL   GFR calc non Af Amer >60 >60 mL/min   GFR calc Af Amer >60 >60 mL/min   Anion gap 12 5 - 15    Comment: Performed at [facility], [place] [address], [place], [place] [address]  CBC with Differential     Status: Abnormal   Collection Time: 08/13/19  6:35 PM  Result Value Ref Range   WBC 13.6 (H) 4.0 - 10.5 K/uL   RBC 4.63 4.22 - 5.81 MIL/uL   Hemoglobin 13.0 13.0 - 17.0 g/dL   HCT 38.4 (L) 39 - 52 %   MCV 82.9 80.0 - 100.0 fL   MCH 28.1 26.0 - 34.0 pg  MCHC 33.9 30.0 - 36.0 g/dL   RDW 12.4 11.5 - 15.5 %   Platelets 200 150 - 400 K/uL   nRBC 0.1 0.0 - 0.2 %   Neutrophils Relative % 70 %   Neutro Abs 9.4 (H) 1.7 - 7.7 K/uL   Lymphocytes Relative 10 %   Lymphs Abs 1.4 0.7 - 4.0 K/uL   Monocytes Relative 11 %   Monocytes Absolute 1.5 (H) 0 - 1 K/uL   Eosinophils Relative 1 %   Eosinophils Absolute 0.2 0 - 0 K/uL   Basophils Relative 0 %   Basophils Absolute 0.1 0 - 0 K/uL   WBC Morphology      MODERATE LEFT SHIFT (>5% METAS AND MYELOS,OCC PRO NOTED)   Immature Granulocytes 8 %   Abs Immature Granulocytes 1.07 (H) 0.00 - 0.07 K/uL   Polychromasia PRESENT     Comment: Performed at [facility], [place] [address] [place], [place] [address]   US Abdomen Limited RUQ  Result Date: 08/13/2019 CLINICAL DATA:  Right upper quadrant pain for 3 days. EXAM: ULTRASOUND  ABDOMEN LIMITED RIGHT UPPER QUADRANT COMPARISON:  None. FINDINGS: Gallbladder: Distended with stones and sludge. Largest stone measures 1.2 cm. Asymmetric gallbladder wall thickening measuring up to 6 mm. There is trace pericholecystic fluid. No sonographic Murphy sign noted by sonographer. Common bile duct: Diameter: 4 mm. Liver: No focal lesion identified. Borderline increased in parenchymal echogenicity. Technically limited evaluation of the left hepatic lobe. Portal vein is patent on color Doppler imaging with normal direction of blood flow towards the liver. Other: Small amount of pericholecystic fluid is well as free fluid about the right lobe of the liver. IMPRESSION: 1. Sonographic findings suspicious for acute cholecystitis. Distended gallbladder with gallstones and sludge, mild wall thickening and pericholecystic fluid. However, sonographic Percell Miller sign is reportedly negative. 2. No biliary dilatation. 3. Borderline hepatic steatosis Electronically Signed   By: Keith Rake M.D.   On: 08/13/2019 19:53    Review of Systems  Constitutional: Positive for appetite change.  HENT: Negative.   Eyes: Negative.   Respiratory: Negative.   Cardiovascular: Negative.   Gastrointestinal: Positive for abdominal pain, nausea and vomiting.  Endocrine: Negative.   Genitourinary: Negative.   Musculoskeletal: Negative.   Skin: Negative.   Neurological: Negative.   Hematological: Negative.   Psychiatric/Behavioral: Negative.     Physical Exam  -----------------  Blood pressure 107/71, pulse (!) 115, temperature 99.9 F (37.7 C), temperature source Oral, resp. rate (!) 22, height 6\' 2"  (1.88 m), weight 102.1 kg, SpO2 95 %.  CONSTITUTIONAL: no acute distress; conversant; no obvious deformities EYES: conjunctiva moist; no lid lag; anicteric; pupils equal bilaterally NECK: trachea midline; no thyroid nodularity LUNGS: respiratory effort normal & unlabored; no wheeze; no rales; no tactile  fremitus CV: rate and rhythm regular; no palpable thrills; no murmur; trace edema bilat lower extremities GI: Abdomen is soft, mildly distended; mild tenderness to palpation in the epigastrium and right upper quadrant with voluntary guarding; no palpable mass;; no hepatosplenomegaly; no obvious hernia MSK: normal range of motion of extremities; no clubbing; no cyanosis PSYCHIATRIC: appropriate affect for situation; alert and oriented to person, place, & time LYMPHATIC: no palpable cervical lymphadenopathy; no evidence lymphedema in extremities  -----------------   Assessment/Plan Acute cholecystitis, cholelithiasis Newly diagnosed diabetes mellitus Probable mild biliary pancreatitis  Admit to general surgery service  IV abx's, IV hydration  Repeat labs in AM 7/13 - check lipase level  Will need cholecystectomy this admission - possibly  tomorrow pending AM labs and evaluation by MD  Patient will be admitted to the general surgery service.  I spoke with he and his sister regarding acute cholecystitis, cholelithiasis, and complications from diabetes.  He should be a good candidate for laparoscopic cholecystectomy.  We will need to monitor his lipase level and make sure that the mild pancreatitis resolves prior to surgery.  Armandina Gemma, MD [facility] Office: [place], MD 08/13/2019, 8:50 PM

## 2019-08-14 ENCOUNTER — Encounter (HOSPITAL_COMMUNITY): Admission: EM | Disposition: A | Discharge status: Home / Self Care | Payer: Self-pay | Source: Home / Self Care

## 2019-08-14 ENCOUNTER — Observation Stay (HOSPITAL_COMMUNITY): Payer: BLUE CROSS/BLUE SHIELD | Admitting: Certified Registered Nurse Anesthetist

## 2019-08-14 ENCOUNTER — Encounter (HOSPITAL_COMMUNITY): Payer: Self-pay

## 2019-08-14 DIAGNOSIS — F17200 Nicotine dependence, unspecified, uncomplicated: Secondary | ICD-10-CM | POA: Diagnosis present

## 2019-08-14 DIAGNOSIS — K82A1 Gangrene of gallbladder in cholecystitis: Secondary | ICD-10-CM | POA: Diagnosis present

## 2019-08-14 DIAGNOSIS — K219 Gastro-esophageal reflux disease without esophagitis: Secondary | ICD-10-CM | POA: Diagnosis present

## 2019-08-14 DIAGNOSIS — Z794 Long term (current) use of insulin: Secondary | ICD-10-CM | POA: Diagnosis not present

## 2019-08-14 DIAGNOSIS — K8000 Calculus of gallbladder with acute cholecystitis without obstruction: Secondary | ICD-10-CM | POA: Diagnosis not present

## 2019-08-14 DIAGNOSIS — Z20822 Contact with and (suspected) exposure to covid-19: Secondary | ICD-10-CM | POA: Diagnosis present

## 2019-08-14 DIAGNOSIS — Z79899 Other long term (current) drug therapy: Secondary | ICD-10-CM | POA: Diagnosis not present

## 2019-08-14 DIAGNOSIS — E119 Type 2 diabetes mellitus without complications: Secondary | ICD-10-CM | POA: Diagnosis present

## 2019-08-14 DIAGNOSIS — K819 Cholecystitis, unspecified: Secondary | ICD-10-CM | POA: Diagnosis present

## 2019-08-14 HISTORY — PX: CHOLECYSTECTOMY: SHX55

## 2019-08-14 LAB — CBC
HCT: 39.3 % (ref 39.0–52.0)
Hemoglobin: 12.8 g/dL — ABNORMAL LOW (ref 13.0–17.0)
MCH: 28 pg (ref 26.0–34.0)
MCHC: 32.6 g/dL (ref 30.0–36.0)
MCV: 86 fL (ref 80.0–100.0)
Platelets: 182 10*3/uL (ref 150–400)
RBC: 4.57 MIL/uL (ref 4.22–5.81)
RDW: 12.8 % (ref 11.5–15.5)
WBC: 10.4 10*3/uL (ref 4.0–10.5)
nRBC: 0 % (ref 0.0–0.2)

## 2019-08-14 LAB — GLUCOSE, CAPILLARY
Glucose-Capillary: 159 mg/dL — ABNORMAL HIGH (ref 70–99)
Glucose-Capillary: 166 mg/dL — ABNORMAL HIGH (ref 70–99)
Glucose-Capillary: 184 mg/dL — ABNORMAL HIGH (ref 70–99)
Glucose-Capillary: 189 mg/dL — ABNORMAL HIGH (ref 70–99)
Glucose-Capillary: 191 mg/dL — ABNORMAL HIGH (ref 70–99)
Glucose-Capillary: 233 mg/dL — ABNORMAL HIGH (ref 70–99)
Glucose-Capillary: 289 mg/dL — ABNORMAL HIGH (ref 70–99)

## 2019-08-14 LAB — COMPREHENSIVE METABOLIC PANEL
ALT: 39 U/L (ref 0–44)
AST: 32 U/L (ref 15–41)
Albumin: 2.2 g/dL — ABNORMAL LOW (ref 3.5–5.0)
Alkaline Phosphatase: 108 U/L (ref 38–126)
Anion gap: 11 (ref 5–15)
BUN: 7 mg/dL (ref 6–20)
CO2: 27 mmol/L (ref 22–32)
Calcium: 8.3 mg/dL — ABNORMAL LOW (ref 8.9–10.3)
Chloride: 97 mmol/L — ABNORMAL LOW (ref 98–111)
Creatinine, Ser: 0.85 mg/dL (ref 0.61–1.24)
GFR calc Af Amer: >60 mL/min (ref >60)
GFR calc non Af Amer: >60 mL/min (ref >60)
Glucose, Bld: 168 mg/dL — ABNORMAL HIGH (ref 70–99)
Potassium: 4 mmol/L (ref 3.5–5.1)
Sodium: 135 mmol/L (ref 135–145)
Total Bilirubin: 1.2 mg/dL (ref 0.3–1.2)
Total Protein: 5.6 g/dL — ABNORMAL LOW (ref 6.5–8.1)

## 2019-08-14 LAB — LIPASE, BLOOD: Lipase: 24 U/L (ref 11–51)

## 2019-08-14 LAB — SURGICAL PCR SCREEN
MRSA, PCR: NEGATIVE
Staphylococcus aureus: NEGATIVE

## 2019-08-14 SURGERY — LAPAROSCOPIC CHOLECYSTECTOMY WITH INTRAOPERATIVE CHOLANGIOGRAM
Anesthesia: General | Site: Abdomen

## 2019-08-14 MED ORDER — LIDOCAINE 2% (20 MG/ML) 5 ML SYRINGE
INTRAMUSCULAR | Status: AC
  Filled 2019-08-14: qty 5

## 2019-08-14 MED ORDER — ESMOLOL HCL 100 MG/10ML IV SOLN
INTRAVENOUS | Status: DC | PRN
  Administered 2019-08-14: 20 mg via INTRAVENOUS
  Administered 2019-08-14: 10 mg via INTRAVENOUS

## 2019-08-14 MED ORDER — DEXAMETHASONE SODIUM PHOSPHATE 10 MG/ML IJ SOLN
INTRAMUSCULAR | Status: AC
  Filled 2019-08-14: qty 1

## 2019-08-14 MED ORDER — LACTATED RINGERS IV SOLN: INTRAVENOUS | Status: DC

## 2019-08-14 MED ORDER — METOPROLOL TARTRATE 5 MG/5ML IV SOLN
5.0000 mg | Freq: Four times a day (QID) | INTRAVENOUS | Status: DC | PRN
  Administered 2019-08-14 (×2): 5 mg via INTRAVENOUS
  Filled 2019-08-14 (×2): qty 5

## 2019-08-14 MED ORDER — SUGAMMADEX SODIUM 500 MG/5ML IV SOLN
INTRAVENOUS | Status: DC | PRN
  Administered 2019-08-14: 400 mg via INTRAVENOUS

## 2019-08-14 MED ORDER — PROPOFOL 10 MG/ML IV BOLUS
INTRAVENOUS | Status: DC | PRN
  Administered 2019-08-14: 200 mg via INTRAVENOUS

## 2019-08-14 MED ORDER — DEXAMETHASONE SODIUM PHOSPHATE 10 MG/ML IJ SOLN
INTRAMUSCULAR | Status: DC | PRN
  Administered 2019-08-14: 4 mg via INTRAVENOUS

## 2019-08-14 MED ORDER — ONDANSETRON HCL 4 MG/2ML IJ SOLN
INTRAMUSCULAR | Status: AC
  Filled 2019-08-14: qty 2

## 2019-08-14 MED ORDER — ONDANSETRON HCL 4 MG/2ML IJ SOLN
INTRAMUSCULAR | Status: DC | PRN
  Administered 2019-08-14: 4 mg via INTRAVENOUS

## 2019-08-14 MED ORDER — HYDROMORPHONE HCL 1 MG/ML IJ SOLN: 0.2500 mg | INTRAMUSCULAR | Status: DC | PRN

## 2019-08-14 MED ORDER — OXYCODONE HCL 5 MG/5ML PO SOLN: 5.0000 mg | Freq: Once | ORAL | Status: DC | PRN

## 2019-08-14 MED ORDER — GABAPENTIN 300 MG PO CAPS
300.0000 mg | ORAL_CAPSULE | ORAL | Status: AC
  Administered 2019-08-14: 300 mg via ORAL
  Filled 2019-08-14: qty 1

## 2019-08-14 MED ORDER — ROCURONIUM BROMIDE 10 MG/ML (PF) SYRINGE
PREFILLED_SYRINGE | INTRAVENOUS | Status: DC | PRN
  Administered 2019-08-14: 10 mg via INTRAVENOUS
  Administered 2019-08-14: 60 mg via INTRAVENOUS

## 2019-08-14 MED ORDER — 0.9 % SODIUM CHLORIDE (POUR BTL) OPTIME
TOPICAL | Status: DC | PRN
  Administered 2019-08-14: 1000 mL

## 2019-08-14 MED ORDER — OXYCODONE HCL 5 MG PO TABS: 5.0000 mg | ORAL_TABLET | Freq: Once | ORAL | Status: DC | PRN

## 2019-08-14 MED ORDER — LACTATED RINGERS IR SOLN
Status: DC | PRN
  Administered 2019-08-14: 2000 mL

## 2019-08-14 MED ORDER — PROPOFOL 10 MG/ML IV BOLUS
INTRAVENOUS | Status: AC
  Filled 2019-08-14: qty 20

## 2019-08-14 MED ORDER — MIDAZOLAM HCL 5 MG/5ML IJ SOLN
INTRAMUSCULAR | Status: DC | PRN
  Administered 2019-08-14: 2 mg via INTRAVENOUS

## 2019-08-14 MED ORDER — BUPIVACAINE HCL (PF) 0.25 % IJ SOLN
INTRAMUSCULAR | Status: DC | PRN
  Administered 2019-08-14: 36 mL

## 2019-08-14 MED ORDER — TRAMADOL HCL 50 MG PO TABS
50.0000 mg | ORAL_TABLET | Freq: Four times a day (QID) | ORAL | Status: DC | PRN
  Administered 2019-08-14: 50 mg via ORAL
  Filled 2019-08-14: qty 1

## 2019-08-14 MED ORDER — LIDOCAINE 2% (20 MG/ML) 5 ML SYRINGE
INTRAMUSCULAR | Status: DC | PRN
  Administered 2019-08-14: 100 mg via INTRAVENOUS

## 2019-08-14 MED ORDER — PHENYLEPHRINE 40 MCG/ML (10ML) SYRINGE FOR IV PUSH (FOR BLOOD PRESSURE SUPPORT)
PREFILLED_SYRINGE | INTRAVENOUS | Status: DC | PRN
  Administered 2019-08-14 (×5): 80 ug via INTRAVENOUS
  Administered 2019-08-14: 120 ug via INTRAVENOUS

## 2019-08-14 MED ORDER — ROCURONIUM BROMIDE 10 MG/ML (PF) SYRINGE
PREFILLED_SYRINGE | INTRAVENOUS | Status: AC
  Filled 2019-08-14: qty 10

## 2019-08-14 MED ORDER — MIDAZOLAM HCL 2 MG/2ML IJ SOLN
INTRAMUSCULAR | Status: AC
  Filled 2019-08-14: qty 2

## 2019-08-14 MED ORDER — ACETAMINOPHEN 500 MG PO TABS
1000.0000 mg | ORAL_TABLET | ORAL | Status: AC
  Administered 2019-08-14: 1000 mg via ORAL
  Filled 2019-08-14: qty 2

## 2019-08-14 MED ORDER — FENTANYL CITRATE (PF) 250 MCG/5ML IJ SOLN
INTRAMUSCULAR | Status: AC
  Filled 2019-08-14: qty 5

## 2019-08-14 MED ORDER — SCOPOLAMINE 1 MG/3DAYS TD PT72
1.0000 | MEDICATED_PATCH | TRANSDERMAL | Status: DC
  Filled 2019-08-14: qty 1

## 2019-08-14 MED ORDER — FENTANYL CITRATE (PF) 250 MCG/5ML IJ SOLN
INTRAMUSCULAR | Status: DC | PRN
  Administered 2019-08-14: 50 ug via INTRAVENOUS
  Administered 2019-08-14: 100 ug via INTRAVENOUS

## 2019-08-14 MED ORDER — SCOPOLAMINE 1 MG/3DAYS TD PT72
MEDICATED_PATCH | TRANSDERMAL | Status: AC
  Administered 2019-08-14: 1.5 mg via TRANSDERMAL
  Filled 2019-08-14: qty 1

## 2019-08-14 MED ORDER — KETOROLAC TROMETHAMINE 15 MG/ML IJ SOLN
15.0000 mg | INTRAMUSCULAR | Status: AC
  Administered 2019-08-14: 15 mg via INTRAVENOUS
  Filled 2019-08-14: qty 1

## 2019-08-14 MED ORDER — PHENYLEPHRINE 40 MCG/ML (10ML) SYRINGE FOR IV PUSH (FOR BLOOD PRESSURE SUPPORT)
PREFILLED_SYRINGE | INTRAVENOUS | Status: AC
  Filled 2019-08-14: qty 20

## 2019-08-14 MED ORDER — BUPIVACAINE HCL 0.25 % IJ SOLN
INTRAMUSCULAR | Status: AC
  Filled 2019-08-14: qty 1

## 2019-08-14 MED ORDER — PROMETHAZINE HCL 25 MG/ML IJ SOLN: 6.2500 mg | INTRAMUSCULAR | Status: DC | PRN

## 2019-08-14 SURGICAL SUPPLY — 41 items
APPLIER CLIP ROT 10 11.4 M/L (STAPLE)
BENZOIN TINCTURE PRP APPL 2/3 (GAUZE/BANDAGES/DRESSINGS) ×2 IMPLANT
BNDG ADH 1X3 SHEER STRL LF (GAUZE/BANDAGES/DRESSINGS) ×2 IMPLANT
CABLE HIGH FREQUENCY MONO STRZ (ELECTRODE) ×2 IMPLANT
CHLORAPREP W/TINT 26 (MISCELLANEOUS) ×2 IMPLANT
CLIP APPLIE ROT 10 11.4 M/L (STAPLE) IMPLANT
CLIP VESOLOCK LG 6/CT PURPLE (CLIP) IMPLANT
CLIP VESOLOCK MED LG 6/CT (CLIP) ×2 IMPLANT
COVER SURGICAL LIGHT HANDLE (MISCELLANEOUS) ×2 IMPLANT
COVER WAND RF STERILE (DRAPES) IMPLANT
DECANTER SPIKE VIAL GLASS SM (MISCELLANEOUS) IMPLANT
DRAIN CHANNEL 19F RND (DRAIN) ×2 IMPLANT
DRAPE C-ARM 42X120 X-RAY (DRAPES) IMPLANT
DRSG TEGADERM 4X4.75 (GAUZE/BANDAGES/DRESSINGS) ×2 IMPLANT
EVACUATOR SILICONE 100CC (DRAIN) ×2 IMPLANT
GLOVE BIOGEL PI IND STRL 7.0 (GLOVE) ×1 IMPLANT
GLOVE BIOGEL PI INDICATOR 7.0 (GLOVE) ×1
GLOVE SURG SS PI 7.0 STRL IVOR (GLOVE) ×2 IMPLANT
GOWN STRL REUS W/TWL LRG LVL3 (GOWN DISPOSABLE) ×2 IMPLANT
GOWN STRL REUS W/TWL XL LVL3 (GOWN DISPOSABLE) ×4 IMPLANT
GRASPER SUT TROCAR 14GX15 (MISCELLANEOUS) ×2 IMPLANT
HEMOSTAT SNOW SURGICEL 2X4 (HEMOSTASIS) ×2 IMPLANT
KIT BASIN OR (CUSTOM PROCEDURE TRAY) ×2 IMPLANT
KIT TURNOVER KIT A (KITS) IMPLANT
POUCH RETRIEVAL ECOSAC 10 (ENDOMECHANICALS) ×1 IMPLANT
POUCH RETRIEVAL ECOSAC 10MM (ENDOMECHANICALS) ×2
SCISSORS LAP 5X35 DISP (ENDOMECHANICALS) ×2 IMPLANT
SET IRRIG TUBING LAPAROSCOPIC (IRRIGATION / IRRIGATOR) ×2 IMPLANT
SET TUBE SMOKE EVAC HIGH FLOW (TUBING) ×2 IMPLANT
SLEEVE XCEL OPT CAN 5 100 (ENDOMECHANICALS) ×4 IMPLANT
SPONGE DRAIN TRACH 4X4 STRL 2S (GAUZE/BANDAGES/DRESSINGS) ×2 IMPLANT
STOPCOCK 4 WAY LG BORE MALE ST (IV SETS) IMPLANT
STRIP CLOSURE SKIN 1/2X4 (GAUZE/BANDAGES/DRESSINGS) ×2 IMPLANT
SUT ETHILON 2 0 PS N (SUTURE) ×2 IMPLANT
SUT MNCRL AB 4-0 PS2 18 (SUTURE) ×2 IMPLANT
SUT VICRYL 0 ENDOLOOP (SUTURE) IMPLANT
TOWEL OR 17X26 10 PK STRL BLUE (TOWEL DISPOSABLE) ×2 IMPLANT
TOWEL OR NON WOVEN STRL DISP B (DISPOSABLE) IMPLANT
TRAY LAPAROSCOPIC (CUSTOM PROCEDURE TRAY) ×2 IMPLANT
TROCAR BLADELESS OPT 5 100 (ENDOMECHANICALS) ×2 IMPLANT
TROCAR XCEL NON-BLD 11X100MML (ENDOMECHANICALS) ×2 IMPLANT

## 2019-08-14 NOTE — Progress Notes (Signed)
Pre Procedure note for inpatients:   Gary Ramirez has been scheduled for Procedure(s): LAPAROSCOPIC CHOLECYSTECTOMY WITH POSSIBLE INTRAOPERATIVE CHOLANGIOGRAM (N/A) today. The various methods of treatment have been discussed with the patient. After consideration of the risks, benefits and treatment options the patient has consented to the planned procedure.   The patient has been seen and labs reviewed. There are no changes in the patient's condition to prevent proceeding with the planned procedure today.  Recent labs:  Lab Results  Component Value Date   WBC 10.4 08/14/2019   HGB 12.8 (L) 08/14/2019   HCT 39.3 08/14/2019   PLT 182 08/14/2019   GLUCOSE 168 (H) 08/14/2019   ALT 39 08/14/2019   AST 32 08/14/2019   NA 135 08/14/2019   K 4.0 08/14/2019   CL 97 (L) 08/14/2019   CREATININE 0.85 08/14/2019   BUN 7 08/14/2019   CO2 27 08/14/2019   HGBA1C 12.3 (H) 08/08/2019    Mickeal Skinner, MD 08/14/2019 9:00 AM

## 2019-08-14 NOTE — Anesthesia Postprocedure Evaluation (Signed)
Anesthesia Post Note  Patient: Gary Ramirez  Procedure(s) Performed: LAPAROSCOPIC CHOLECYSTECTOMY (N/A Abdomen)     Patient location during evaluation: PACU Anesthesia Type: General Level of consciousness: awake Pain management: pain level controlled Vital Signs Assessment: post-procedure vital signs reviewed and stable Respiratory status: spontaneous breathing Cardiovascular status: stable Postop Assessment: no apparent nausea or vomiting Anesthetic complications: no   No complications documented.  Last Vitals:  Vitals:   08/14/19 1500 08/14/19 1624  BP: 123/76 119/77  Pulse: (!) 120 (!) 102  Resp: 15 18  Temp: 36.4 C 37 C  SpO2: 93% 95%    Last Pain:  Vitals:   08/14/19 1500  TempSrc: Oral  PainSc: [name]

## 2019-08-14 NOTE — Anesthesia Preprocedure Evaluation (Addendum)
Anesthesia Evaluation  Patient identified by MRN, date of birth, ID band Patient awake    Reviewed: Allergy & Precautions, NPO status , Patient's Chart, lab work & pertinent test results  Airway Mallampati: III  TM Distance: >3 FB Neck ROM: Full    Dental no notable dental hx.    Pulmonary former smoker,    Pulmonary exam normal breath sounds clear to auscultation       Cardiovascular negative cardio ROS Normal cardiovascular exam Rhythm:Regular Rate:Normal  ECG: ST, rate 107   Neuro/Psych negative neurological ROS  negative psych ROS   GI/Hepatic Neg liver ROS, GERD  Medicated and Controlled,  Endo/Other  diabetes, Insulin Dependent  Renal/GU negative Renal ROS     Musculoskeletal negative musculoskeletal ROS (+)   Abdominal   Peds  Hematology negative hematology ROS (+)   Anesthesia Other Findings acute cholecystitis  Reproductive/Obstetrics                            Anesthesia Physical Anesthesia Plan  ASA: II  Anesthesia Plan: General   Post-op Pain Management:    Induction: Intravenous  PONV Risk Score and Plan: 2 and Ondansetron, Dexamethasone, Midazolam and Treatment may vary due to age or medical condition  Airway Management Planned: Oral ETT  Additional Equipment:   Intra-op Plan:   Post-operative Plan: Extubation in OR  Informed Consent: I have reviewed the patients History and Physical, chart, labs and discussed the procedure including the risks, benefits and alternatives for the proposed anesthesia with the patient or authorized representative who has indicated his/her understanding and acceptance.     Dental advisory given  Plan Discussed with: CRNA  Anesthesia Plan Comments:        Anesthesia Quick Evaluation

## 2019-08-14 NOTE — Anesthesia Procedure Notes (Signed)
Procedure Name: Intubation Date/Time: 08/14/2019 11:53 AM Performed by: West Pugh, CRNA Pre-anesthesia Checklist: Patient identified, Emergency Drugs available, Suction available, Patient being monitored and Timeout performed Patient Re-evaluated:Patient Re-evaluated prior to induction Oxygen Delivery Method: Circle system utilized Preoxygenation: Pre-oxygenation with 100% oxygen Induction Type: IV induction Ventilation: Oral airway inserted - appropriate to patient size and Two handed mask ventilation required Laryngoscope Size: Mac and 3 Grade View: Grade I Tube type: Oral Tube size: 7.5 mm Number of attempts: 1 Airway Equipment and Method: Stylet Placement Confirmation: ETT inserted through vocal cords under direct vision,  positive ETCO2,  CO2 detector and breath sounds checked- equal and bilateral Secured at: 22 cm Tube secured with: Tape Dental Injury: Teeth and Oropharynx as per pre-operative assessment

## 2019-08-14 NOTE — Op Note (Signed)
PATIENT:  Gary Ramirez  44 y.o. male  PRE-OPERATIVE DIAGNOSIS:  acute cholecystitis  POST-OPERATIVE DIAGNOSIS:  Acute gangrenous  cholecystitis  PROCEDURE:  Procedure(s): LAPAROSCOPIC CHOLECYSTECTOMY   SURGEON:  Surgeon(s): Kowen Kluth, Arta Bruce, MD  ASSISTANT: Alyson Reedy, PAC  ANESTHESIA:   local and general  Indications for procedure: Gary Ramirez is a 44 y.o. male with symptoms of Abdominal pain and Nausea and vomiting consistent with gallbladder disease, Confirmed by Ultrasound.  Description of procedure: The patient was brought into the operative suite, placed supine. Anesthesia was administered with endotracheal tube. Patient was strapped in place and foot board was secured. All pressure points were offloaded by foam padding. The patient was prepped and draped in the usual sterile fashion.  A small incision was made to the right of the umbilicus. A 33mm trocar was inserted into the peritoneal cavity with optical entry. Pneumoperitoneum was applied with high flow low pressure. 2 63mm trocars were placed in the RUQ. A 32mm trocar was placed in the subxiphoid space. Marcaine was infused to the subxiphoid space and lateral upper right abdomen in the transversus abdominis plane. Next the patient was placed in reverse trendelenberg. The omentum was adhered to the liver and gallbladder. With blunt dissection it was freed to show a gangrenous gallbladder. The galllbladder drained out during this manipulation.   The gallbladder was retracted cephalad and lateral. The peritoneum was reflected off the infundibulum working lateral to medial. The cystic duct and cystic artery were identified and further dissection revealed a critical view. The cystic duct and cystic artery were doubly clipped and ligated.   The gallbladder was removed off the liver bed with cautery. The gallbladder liver interface had liquefactive necrosis. The Gallbladder was placed in a specimen bag. The  gallbladder fossa was irrigated and hemostasis was applied with cautery. The gallbladder was removed via the 46mm trocar. A 19 fr blake drain was placed with the tip in the gallbladder fossa and brought out the right lateral port site and sutured in place with a 2-0 Nylon. The fascia was closed with 0 vicryl with suture passer. Pneumoperitoneum was removed, all trocar were removed. All incisions were closed with 4-0 monocryl subcuticular stitch. The patient woke from anesthesia and was brought to PACU in stable condition. All counts were correct  Findings: gangrenous cholecystitis  Specimen: gallbladder  Blood loss: 100 ml  Local anesthesia: 36 ml marcaine  Complications: none  PLAN OF CARE: Admit to inpatient   PATIENT DISPOSITION:  PACU - hemodynamically stable.  Images:    Gurney Maxin, M.D. General, Bariatric, & Minimally Invasive Surgery [facility]

## 2019-08-14 NOTE — Transfer of Care (Signed)
Immediate Anesthesia Transfer of Care Note  Patient: Gary Ramirez  Procedure(s) Performed: LAPAROSCOPIC CHOLECYSTECTOMY (N/A Abdomen)  Patient Location: PACU  Anesthesia Type:General  Level of Consciousness: awake, alert  and patient cooperative  Airway & Oxygen Therapy: Patient Spontanous Breathing and Patient connected to face mask oxygen  Post-op Assessment: Report given to RN and Post -op Vital signs reviewed and stable  Post vital signs: Reviewed and stable  Last Vitals:  Vitals Value Taken Time  BP 113/72 08/14/19 1330  Temp    Pulse 119 08/14/19 1331  Resp 21 08/14/19 1331  SpO2 96 % 08/14/19 1331  Vitals shown include unvalidated device data.  Last Pain:  Vitals:   08/14/19 1122  TempSrc:   PainSc: 0-No pain      Patients Stated Pain Goal: 3 (03/49/17 9150)  Complications: No complications documented.

## 2019-08-14 NOTE — Progress Notes (Signed)
MD made aware of consistent tachycardia. Patient expresses he is in minimal pain and is resting in bed. New orders in [facility]. Will initiate yellow MEWS protocol.

## 2019-08-14 NOTE — Progress Notes (Signed)
Patient HR was 112 which made him a yellow MEWS, PRN IV metoprolol  5mg  was administered heart rate was rechecked, HR was 83, returning to a green MEWS.

## 2019-08-14 NOTE — Progress Notes (Signed)
Patient MEWS turned yellow upon assessment and med pass. Patient c/o 9/10 pain in RUQ. IV dilaudid given. Will recheck vitals.

## 2019-08-15 ENCOUNTER — Encounter (HOSPITAL_COMMUNITY): Payer: Self-pay | Admitting: General Surgery

## 2019-08-15 LAB — COMPREHENSIVE METABOLIC PANEL
ALT: 42 U/L (ref 0–44)
AST: 55 U/L — ABNORMAL HIGH (ref 15–41)
Albumin: 2.1 g/dL — ABNORMAL LOW (ref 3.5–5.0)
Alkaline Phosphatase: 100 U/L (ref 38–126)
Anion gap: 10 (ref 5–15)
BUN: 10 mg/dL (ref 6–20)
CO2: 25 mmol/L (ref 22–32)
Calcium: 7.8 mg/dL — ABNORMAL LOW (ref 8.9–10.3)
Chloride: 97 mmol/L — ABNORMAL LOW (ref 98–111)
Creatinine, Ser: 0.77 mg/dL (ref 0.61–1.24)
GFR calc Af Amer: >60 mL/min (ref >60)
GFR calc non Af Amer: >60 mL/min (ref >60)
Glucose, Bld: 233 mg/dL — ABNORMAL HIGH (ref 70–99)
Potassium: 4.4 mmol/L (ref 3.5–5.1)
Sodium: 132 mmol/L — ABNORMAL LOW (ref 135–145)
Total Bilirubin: 1.2 mg/dL (ref 0.3–1.2)
Total Protein: 5.4 g/dL — ABNORMAL LOW (ref 6.5–8.1)

## 2019-08-15 LAB — CBC
HCT: 32.9 % — ABNORMAL LOW (ref 39.0–52.0)
Hemoglobin: 10.9 g/dL — ABNORMAL LOW (ref 13.0–17.0)
MCH: 28.2 pg (ref 26.0–34.0)
MCHC: 33.1 g/dL (ref 30.0–36.0)
MCV: 85.2 fL (ref 80.0–100.0)
Platelets: 194 10*3/uL (ref 150–400)
RBC: 3.86 MIL/uL — ABNORMAL LOW (ref 4.22–5.81)
RDW: 13.2 % (ref 11.5–15.5)
WBC: 13.6 10*3/uL — ABNORMAL HIGH (ref 4.0–10.5)
nRBC: 0.1 % (ref 0.0–0.2)

## 2019-08-15 LAB — GLUCOSE, CAPILLARY
Glucose-Capillary: 234 mg/dL — ABNORMAL HIGH (ref 70–99)
Glucose-Capillary: 207 mg/dL — ABNORMAL HIGH (ref 70–99)

## 2019-08-15 LAB — SURGICAL PATHOLOGY

## 2019-08-15 MED ORDER — OXYCODONE HCL 5 MG PO TABS
5.0000 mg | ORAL_TABLET | Freq: Four times a day (QID) | ORAL | 0 refills | Status: AC | PRN
Start: 2019-08-15 — End: ?

## 2019-08-15 MED ORDER — IBUPROFEN 800 MG PO TABS
800.0000 mg | ORAL_TABLET | Freq: Three times a day (TID) | ORAL | 0 refills | Status: AC | PRN
Start: 2019-08-15 — End: ?

## 2019-08-15 NOTE — Progress Notes (Signed)
Discharge instructions given to pt and all questions were answered. RN taught pts wife drain care.

## 2019-08-15 NOTE — Discharge Summary (Signed)
Physician Discharge Summary  Gary Ramirez UVO:[id number] DOB: 1976/01/13 DOA: 08/13/2019  PCP: Lawerance Cruel, MD  Admit date: 08/13/2019 Discharge date: 08/15/2019  Recommendations for Outpatient Follow-up:  1.  (include homehealth, outpatient follow-up instructions, specific recommendations for PCP to follow-up on, etc.)   Follow-up Information    [facility], [place]. Go on 08/22/2019.   Specialty: General Surgery Why: Follow up for drain removal scheduled for 10:45 AM.  Contact information: [place] [phone]             Discharge Diagnoses:  Principal Problem:   Cholecystitis, acute with cholelithiasis   Surgical Procedure: lap chole  Discharge Condition: Good Disposition: Home  Diet recommendation: low fat diet for 2 weeks   Hospital Course:  44 yo male presented with acute cholecystitis. He underwent lap chole. POD 1 he was doing well with ambulation and had minimal pain. He had some old blood in the drain. He was discharged home with follow up.  Discharge Instructions  Discharge Instructions    Call MD for:  difficulty breathing, headache or visual disturbances   Complete by: As directed    Call MD for:  persistant nausea and vomiting   Complete by: As directed    Call MD for:  redness, tenderness, or signs of infection (pain, swelling, redness, odor or green/yellow discharge around incision site)   Complete by: As directed    Call MD for:  severe uncontrolled pain   Complete by: As directed    Call MD for:  temperature >100.4   Complete by: As directed    Diet - low sodium heart healthy   Complete by: As directed    Discharge wound care:   Complete by: As directed    Remove bandaids today. Ok to shower  Steristrips will likely peel off in 1-3 weeks  Empty drain daily and when full   Increase activity slowly   Complete by: As directed    Lifting restrictions    Complete by: As directed    Do not lift more than 20 pounds for 3-4 weeks     Allergies as of 08/15/2019   No Known Allergies     Medication List    TAKE these medications   fenofibrate 54 MG tablet Take 54 mg by mouth daily.   FLUoxetine 40 MG capsule Commonly known as: PROZAC Take 40 mg by mouth daily.   ibuprofen 800 MG tablet Commonly known as: ADVIL Take 1 tablet (800 mg total) by mouth every 8 (eight) hours as needed.   insulin aspart 100 UNIT/ML FlexPen Commonly known as: NOVOLOG Inject 8 Units into the skin 3 (three) times daily with meals.   insulin detemir 100 UNIT/ML injection Commonly known as: LEVEMIR Inject 0.26 mLs (26 Units total) into the skin every 12 (twelve) hours.   ondansetron 4 MG disintegrating tablet Commonly known as: ZOFRAN-ODT Take 4 mg by mouth every 8 (eight) hours as needed for nausea/vomiting.   oxyCODONE 5 MG immediate release tablet Commonly known as: Oxy IR/ROXICODONE Take 1 tablet (5 mg total) by mouth every 6 (six) hours as needed for severe pain.   pantoprazole 40 MG tablet Commonly known as: PROTONIX Take 1 tablet by mouth daily.            Discharge Care Instructions  (From admission, onward)         Start     Ordered   08/15/19 0000  Discharge wound care:  Comments: Remove bandaids today. Ok to shower  Steristrips will likely peel off in 1-3 weeks  Empty drain daily and when full   08/15/19 [facility] Go on 08/22/2019.   Specialty: General Surgery Why: Follow up for drain removal scheduled for 10:45 AM.  Contact information: [address] [place] [phone]               The results of significant diagnostics from this hospitalization (including imaging, microbiology, ancillary and laboratory) are listed below for reference.    Significant Diagnostic Studies: US Abdomen Limited  RUQ  Result Date: 08/13/2019 CLINICAL DATA:  Right upper quadrant pain for 3 days. EXAM: ULTRASOUND ABDOMEN LIMITED RIGHT UPPER QUADRANT COMPARISON:  None. FINDINGS: Gallbladder: Distended with stones and sludge. Largest stone measures 1.2 cm. Asymmetric gallbladder wall thickening measuring up to 6 mm. There is trace pericholecystic fluid. No sonographic Murphy sign noted by sonographer. Common bile duct: Diameter: 4 mm. Liver: No focal lesion identified. Borderline increased in parenchymal echogenicity. Technically limited evaluation of the left hepatic lobe. Portal vein is patent on color Doppler imaging with normal direction of blood flow towards the liver. Other: Small amount of pericholecystic fluid is well as free fluid about the right lobe of the liver. IMPRESSION: 1. Sonographic findings suspicious for acute cholecystitis. Distended gallbladder with gallstones and sludge, mild wall thickening and pericholecystic fluid. However, sonographic Percell Miller sign is reportedly negative. 2. No biliary dilatation. 3. Borderline hepatic steatosis Electronically Signed   By: Keith Rake M.D.   On: 08/13/2019 19:53    Labs: Basic Metabolic Panel: Recent Labs  Lab 08/08/19 1259 08/08/19 1259 08/08/19 1721 08/08/19 1721 08/08/19 2108 08/08/19 2108 08/09/19 0136 08/09/19 0136 08/09/19 4782 08/09/19 9562 08/09/19 1027 08/09/19 1027 08/10/19 0246 08/11/19 0416 08/13/19 1829 08/14/19 0458 08/15/19 0519  NA 150*  --  149*  --  149*  --  151*  --  149*  --  146*  --  139 138 134* 135 132*  K 4.0   < > 4.1   < > 3.5   < > 4.0   < > 3.7   < > 3.5   < > 3.2* 4.0 3.4* 4.0 4.4  CL 116*   < > 115*   < > 118*   < > 116*   < > 116*   < > 112*   < > 106 101 96* 97* 97*  CO2 15*   < > 16*   < > 17*   < > 17*   < > 19*   < > 22   < > 22 24 26 27 25   GLUCOSE 256*   < > 255*   < > 257*   < > 234*   < > 263*   < > 271*   < > 244* 203* 142* 168* 233*  BUN 38*   < > 34*   < > 31*   < > 27*   < > 25*   < > 22*    < > 17 11 7 7 10   CREATININE 1.64*   < > 1.61*   < > 1.37*   < > 1.31*   < > 1.23   < > 1.23   < > 0.98 0.92 0.80 0.85 0.77  CALCIUM 8.9   < > 8.6*   < > 9.0   < >  9.3   < > 9.0   < > 8.8*   < > 8.5* 8.7* 8.6* 8.3* 7.8*  MG  --   --   --   --   --   --   --   --  2.9*  --   --   --  2.4  --   --   --   --   PHOS  --   --   --   --   --   --   --   --  1.8*  --   --   --  3.7  --   --   --   --    < > = values in this interval not displayed.   Liver Function Tests: Recent Labs  Lab 08/10/19 0246 08/13/19 1829 08/14/19 0458 08/15/19 0519  AST 38 29 32 55*  ALT 66* 44 39 42  ALKPHOS 65 122 108 100  BILITOT 1.5* 1.1 1.2 1.2  PROT 6.0* 6.1* 5.6* 5.4*  ALBUMIN 3.4* 2.5* 2.2* 2.1*    CBC: Recent Labs  Lab 08/09/19 0644 08/10/19 0246 08/13/19 1835 08/14/19 0458 08/15/19 0519  WBC 13.5* 8.2 13.6* 10.4 13.6*  NEUTROABS  --   --  9.4*  --   --   HGB 14.5 12.5* 13.0 12.8* 10.9*  HCT 42.2 37.0* 38.4* 39.3 32.9*  MCV 83.4 83.0 82.9 86.0 85.2  PLT 199 144* 200 182 194    CBG: Recent Labs  Lab 08/14/19 1547 08/14/19 2010 08/14/19 2344 08/15/19 0442 08/15/19 0737  GLUCAP 191* 289* 233* 234* 207*    Principal Problem:   Cholecystitis, acute with cholelithiasis   Time coordinating discharge: 15 min

## 2019-08-15 NOTE — Discharge Instructions (Signed)
[facility] LAPAROSCOPIC SURGERY: POST OP INSTRUCTIONS Always review your discharge instruction sheet given to you by the facility where your surgery was performed. IF YOU HAVE DISABILITY OR FAMILY LEAVE FORMS, YOU MUST BRING THEM TO THE OFFICE FOR PROCESSING.   DO NOT GIVE THEM TO YOUR DOCTOR.  PAIN CONTROL  1. First take acetaminophen (Tylenol) AND/or ibuprofen (Advil) to control your pain after surgery.  Follow directions on package.  Taking acetaminophen (Tylenol) and/or ibuprofen (Advil) regularly after surgery will help to control your pain and lower the amount of prescription pain medication you may need.  You should not take more than 3,000 mg (3 grams) of acetaminophen (Tylenol) in 24 hours.  You should not take ibuprofen (Advil), aleve, motrin, naprosyn or other NSAIDS if you have a history of stomach ulcers or chronic kidney disease.  2. A prescription for pain medication may be given to you upon discharge.  Take your pain medication as prescribed, if you still have uncontrolled pain after taking acetaminophen (Tylenol) or ibuprofen (Advil). 3. Use ice packs to help control pain. 4. If you need a refill on your pain medication, please contact your pharmacy.  They will contact our office to request authorization. Prescriptions will not be filled after 5pm or on week-ends.  HOME MEDICATIONS 5. Take your usually prescribed medications unless otherwise directed.  DIET 6. You should follow a light diet the first few days after arrival home.  Be sure to include lots of fluids daily. Avoid fatty, fried foods.   CONSTIPATION 7. It is common to experience some constipation after surgery and if you are taking pain medication.  Increasing fluid intake and taking a stool softener (such as Colace) will usually help or prevent this problem from occurring.  A mild laxative (Milk of Magnesia or Miralax) should be taken according to package instructions if there are no bowel  movements after 48 hours.  WOUND/INCISION CARE 8. Most patients will experience some swelling and bruising in the area of the incisions.  Ice packs will help.  Swelling and bruising can take several days to resolve.  9. Unless discharge instructions indicate otherwise, follow guidelines below  a. STERI-STRIPS - you may remove your outer bandages 48 hours after surgery, and you may shower at that time.  You have steri-strips (small skin tapes) in place directly over the incision.  These strips should be left on the skin for 7-10 days.   b. DERMABOND/SKIN GLUE - you may shower in 24 hours.  The glue will flake off over the next 2-3 weeks. 10. Any sutures or staples will be removed at the office during your follow-up visit.  ACTIVITIES 11. You may resume regular (light) daily activities beginning the next day--such as daily self-care, walking, climbing stairs--gradually increasing activities as tolerated.  You may have sexual intercourse when it is comfortable.  Refrain from any heavy lifting or straining until approved by your doctor. a. You may drive when you are no longer taking prescription pain medication, you can comfortably wear a seatbelt, and you can safely maneuver your car and apply brakes.  FOLLOW-UP 12. You should see your doctor in the office for a follow-up appointment approximately 2-3 weeks after your surgery.  You should have been given your post-op/follow-up appointment when your surgery was scheduled.  If you did not receive a post-op/follow-up appointment, make sure that you call for this appointment within a day or two after you arrive home to insure a convenient appointment time.  WHEN   TO CALL YOUR DOCTOR: 1. Fever over 101.0 2. Inability to urinate 3. Continued bleeding from incision. 4. Increased pain, redness, or drainage from the incision. 5. Increasing abdominal pain  The clinic staff is available to answer your questions during regular business hours.  Please don't  hesitate to call and ask to speak to one of the nurses for clinical concerns.  If you have a medical emergency, go to the nearest emergency room or call 911.  A surgeon from [facility] is always on call at the hospital. [address], [place], [place], [place]  [address] ? [address], [place], [place]   [address] [phone] ? [phone] ? FAX [phone] Web site: [email or web address]   [facility] Surgical drains are used to remove extra fluid that normally builds up in a surgical wound after surgery. A surgical drain helps to heal a surgical wound. Different kinds of surgical drains include:  Active drains. These drains use suction to pull drainage away from the surgical wound. Drainage flows through a tube to a container outside of the body. With these drains, you need to keep the bulb or the drainage container flat (compressed) at all times, except while you empty it. Flattening the bulb or container creates suction.  Passive drains. These drains allow fluid to drain naturally, by gravity. Drainage flows through a tube to a bandage (dressing) or a container outside of the body. Passive drains do not need to be emptied. A drain is placed during surgery. Right after surgery, drainage is usually bright red and a little thicker than water. The drainage may gradually turn yellow or pink and become thinner. It is likely that your health care provider will remove the drain when the drainage stops or when the amount decreases to 1-2 Tbsp (15-30 mL) during a 24-hour period. Supplies needed:  Tape.  Germ-free cleaning solution (sterile saline).  Cotton swabs.  Split gauze drain sponge: 4 x 4 inches (10 x 10 cm).  Gauze square: 4 x 4 inches (10 x 10 cm). How to care for your surgical drain Care for your drain as told by your health care provider. This is important to help prevent infection. If your drain is placed at your back, or any other  hard-to-reach area, ask another person to assist you in performing the following tasks: General care  Keep the skin around the drain dry and covered with a dressing at all times.  Check your drain area every day for signs of infection. Check for: ? Redness, swelling, or pain. ? Pus or a bad smell. ? Cloudy drainage. ? Tenderness or pressure at the drain exit site. Changing the dressing Follow instructions from your health care provider about how to change your dressing. Change your dressing at least once a day. Change it more often if needed to keep the dressing dry. Make sure you: 1. Gather your supplies. 2. Wash your hands with soap and water before you change your dressing. If soap and water are not available, use hand sanitizer. 3. Remove the old dressing. Avoid using scissors to do that. 4. Wash your hands with soap and water again after removing the old dressing. 5. Use sterile saline to clean your skin around the drain. You may need to use a cotton swab to clean the skin. 6. Place the tube through the slit in a drain sponge. Place the drain sponge so that it covers your wound. 7. Place the gauze square or another drain sponge  on top of the drain sponge that is on the wound. Make sure the tube is between those layers. 8. Tape the dressing to your skin. 9. Tape the drainage tube to your skin 1-2 inches (2.5-5 cm) below the place where the tube enters your body. Taping keeps the tube from pulling on any stitches (sutures) that you have. 10. Wash your hands with soap and water. 11. Write down the color of your drainage and how often you change your dressing. How to empty your active drain  1. Make sure that you have a measuring cup that you can empty your drainage into. 2. Wash your hands with soap and water. If soap and water are not available, use hand sanitizer. 3. Loosen any pins or clips that hold the tube in place. 4. If your health care provider tells you to strip the tube to  prevent clots and tube blockages: ? Hold the tube at the skin with one hand. Use your other hand to pinch the tubing with your thumb and first finger. ? Gently move your fingers down the tube while squeezing very lightly. This clears any drainage, clots, or tissue from the tube. ? You may need to do this several times each day to keep the tube clear. Do not pull on the tube. 5. Open the bulb cap or the drain plug. Do not touch the inside of the cap or the bottom of the plug. 6. Turn the device upside down and gently squeeze. 7. Empty all of the drainage into the measuring cup. 8. Compress the bulb or the container and replace the cap or the plug. To compress the bulb or the container, squeeze it firmly in the middle while you close the cap or plug the container. 9. Write down the amount of drainage that you have in each 24-hour period. If you have less than 2 Tbsp (30 mL) of drainage during 24 hours, contact your health care provider. 10. Flush the drainage down the toilet. 11. Wash your hands with soap and water. Contact a health care provider if:  You have redness, swelling, or pain around your drain area.  You have pus or a bad smell coming from your drain area.  You have a fever or chills.  The skin around your drain is warm to the touch.  The amount of drainage that you have is increasing instead of decreasing.  You have drainage that is cloudy.  There is a sudden stop or a sudden decrease in the amount of drainage that you have.  Your drain tube falls out.  Your active drain does not stay compressed after you empty it. Summary  Surgical drains are used to remove extra fluid that normally builds up in a surgical wound after surgery.  Different kinds of surgical drains include active drains and passive drains. Active drains use suction to pull drainage away from the surgical wound, and passive drains allow fluid to drain naturally.  It is important to care for your drain to  prevent infection. If your drain is placed at your back, or any other hard-to-reach area, ask another person to assist you.  Contact your health care provider if you have redness, swelling, or pain around your drain area. This information is not intended to replace advice given to you by your health care provider. Make sure you discuss any questions you have with your health care provider. Document Revised: 02/22/2018 Document Reviewed: 02/22/2018 Elsevier Patient Education  2020 [employer].

## 2019-09-04 DIAGNOSIS — E1165 Type 2 diabetes mellitus with hyperglycemia: Secondary | ICD-10-CM | POA: Diagnosis not present

## 2019-09-04 DIAGNOSIS — Z794 Long term (current) use of insulin: Secondary | ICD-10-CM | POA: Diagnosis not present

## 2019-09-05 DIAGNOSIS — E1165 Type 2 diabetes mellitus with hyperglycemia: Secondary | ICD-10-CM | POA: Diagnosis not present

## 2019-09-05 DIAGNOSIS — Z794 Long term (current) use of insulin: Secondary | ICD-10-CM | POA: Diagnosis not present

## 2019-10-09 DIAGNOSIS — D2361 Other benign neoplasm of skin of right upper limb, including shoulder: Secondary | ICD-10-CM | POA: Diagnosis not present

## 2019-10-09 DIAGNOSIS — D485 Neoplasm of uncertain behavior of skin: Secondary | ICD-10-CM | POA: Diagnosis not present

## 2019-10-16 ENCOUNTER — Ambulatory Visit: Payer: Self-pay | Admitting: Family Medicine

## 2019-12-04 DIAGNOSIS — Z7984 Long term (current) use of oral hypoglycemic drugs: Secondary | ICD-10-CM | POA: Diagnosis not present

## 2019-12-04 DIAGNOSIS — E1165 Type 2 diabetes mellitus with hyperglycemia: Secondary | ICD-10-CM | POA: Diagnosis not present

## 2019-12-04 DIAGNOSIS — Z79899 Other long term (current) drug therapy: Secondary | ICD-10-CM | POA: Diagnosis not present

## 2020-01-18 DIAGNOSIS — M545 Low back pain, unspecified: Secondary | ICD-10-CM | POA: Diagnosis not present

## 2020-02-07 DIAGNOSIS — M545 Low back pain, unspecified: Secondary | ICD-10-CM | POA: Diagnosis not present

## 2020-02-20 DIAGNOSIS — Z0000 Encounter for general adult medical examination without abnormal findings: Secondary | ICD-10-CM | POA: Diagnosis not present

## 2020-02-20 DIAGNOSIS — H6123 Impacted cerumen, bilateral: Secondary | ICD-10-CM | POA: Diagnosis not present

## 2020-02-20 DIAGNOSIS — F419 Anxiety disorder, unspecified: Secondary | ICD-10-CM | POA: Diagnosis not present

## 2020-02-20 DIAGNOSIS — E782 Mixed hyperlipidemia: Secondary | ICD-10-CM | POA: Diagnosis not present

## 2020-02-20 DIAGNOSIS — K219 Gastro-esophageal reflux disease without esophagitis: Secondary | ICD-10-CM | POA: Diagnosis not present

## 2020-02-20 DIAGNOSIS — F329 Major depressive disorder, single episode, unspecified: Secondary | ICD-10-CM | POA: Diagnosis not present

## 2020-02-29 DIAGNOSIS — M545 Low back pain, unspecified: Secondary | ICD-10-CM | POA: Diagnosis not present

## 2020-03-07 DIAGNOSIS — E119 Type 2 diabetes mellitus without complications: Secondary | ICD-10-CM | POA: Diagnosis not present

## 2020-04-08 DIAGNOSIS — M545 Low back pain, unspecified: Secondary | ICD-10-CM | POA: Diagnosis not present

## 2020-10-16 DIAGNOSIS — D225 Melanocytic nevi of trunk: Secondary | ICD-10-CM | POA: Diagnosis not present

## 2020-10-16 DIAGNOSIS — L814 Other melanin hyperpigmentation: Secondary | ICD-10-CM | POA: Diagnosis not present

## 2020-10-16 DIAGNOSIS — L821 Other seborrheic keratosis: Secondary | ICD-10-CM | POA: Diagnosis not present

## 2020-10-16 DIAGNOSIS — L738 Other specified follicular disorders: Secondary | ICD-10-CM | POA: Diagnosis not present

## 2021-01-19 DIAGNOSIS — Z13220 Encounter for screening for lipoid disorders: Secondary | ICD-10-CM | POA: Diagnosis not present

## 2021-01-19 DIAGNOSIS — Z8639 Personal history of other endocrine, nutritional and metabolic disease: Secondary | ICD-10-CM | POA: Diagnosis not present

## 2021-01-19 DIAGNOSIS — Z0000 Encounter for general adult medical examination without abnormal findings: Secondary | ICD-10-CM | POA: Diagnosis not present

## 2021-03-02 DIAGNOSIS — J069 Acute upper respiratory infection, unspecified: Secondary | ICD-10-CM | POA: Diagnosis not present

## 2021-03-09 DIAGNOSIS — E119 Type 2 diabetes mellitus without complications: Secondary | ICD-10-CM | POA: Diagnosis not present

## 2021-04-02 DIAGNOSIS — E782 Mixed hyperlipidemia: Secondary | ICD-10-CM | POA: Diagnosis not present

## 2021-04-02 DIAGNOSIS — Z0000 Encounter for general adult medical examination without abnormal findings: Secondary | ICD-10-CM | POA: Diagnosis not present

## 2021-04-02 DIAGNOSIS — F411 Generalized anxiety disorder: Secondary | ICD-10-CM | POA: Diagnosis not present

## 2021-04-02 DIAGNOSIS — F329 Major depressive disorder, single episode, unspecified: Secondary | ICD-10-CM | POA: Diagnosis not present

## 2021-04-02 DIAGNOSIS — K219 Gastro-esophageal reflux disease without esophagitis: Secondary | ICD-10-CM | POA: Diagnosis not present

## 2021-05-19 DIAGNOSIS — W540XXA Bitten by dog, initial encounter: Secondary | ICD-10-CM | POA: Diagnosis not present

## 2021-05-19 DIAGNOSIS — S61252A Open bite of right middle finger without damage to nail, initial encounter: Secondary | ICD-10-CM | POA: Diagnosis not present

## 2021-06-06 IMAGING — US US ABDOMEN LIMITED
1 series · 14 of 25 positions shown · non-contrast
Comparison: None.

CLINICAL DATA: Right upper quadrant pain for 3 days.

EXAM:
ULTRASOUND ABDOMEN LIMITED RIGHT UPPER QUADRANT

[Series 1: us abdomen limited · 14 of 48 slices shown]
[im 1/48]
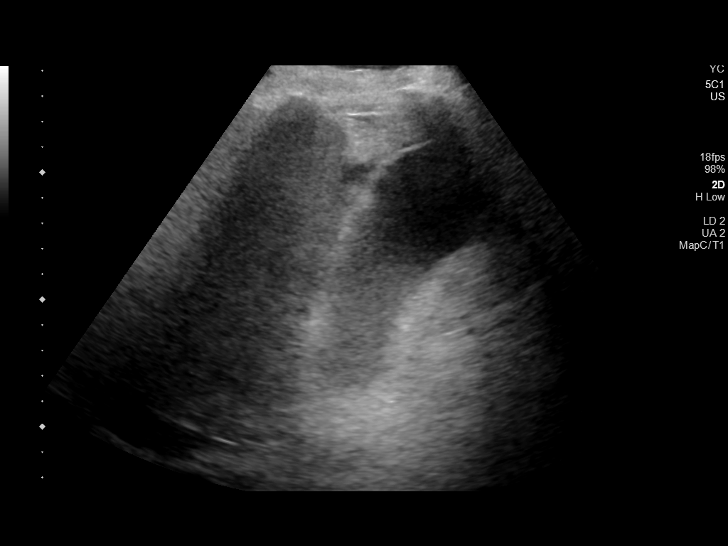
[im 4/48]
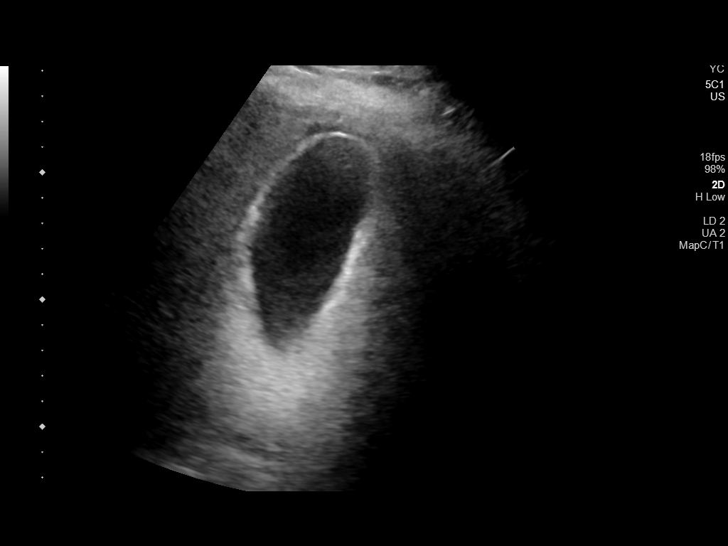
[im 8/48]
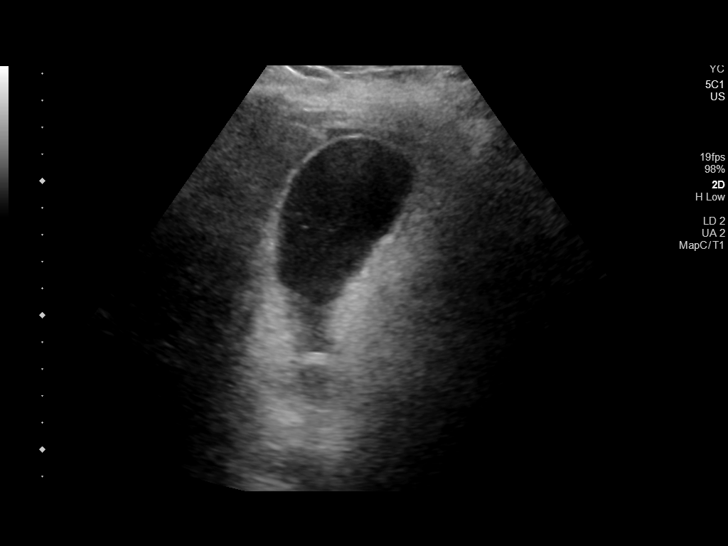
[im 12/48]
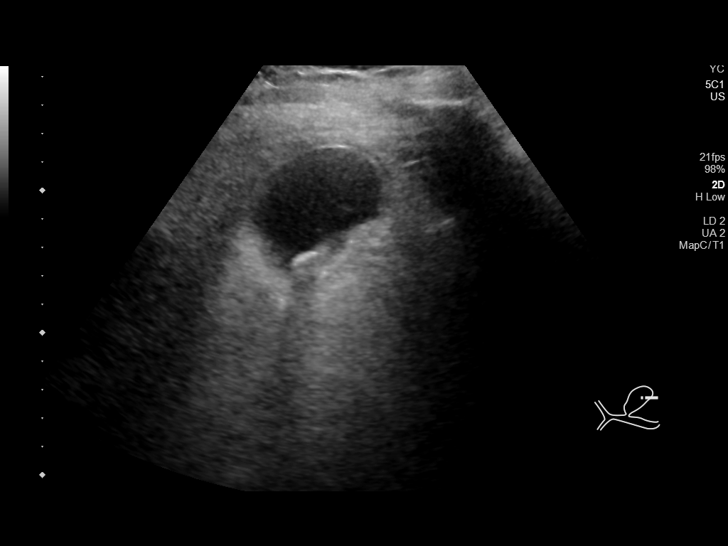
[im 16/48]
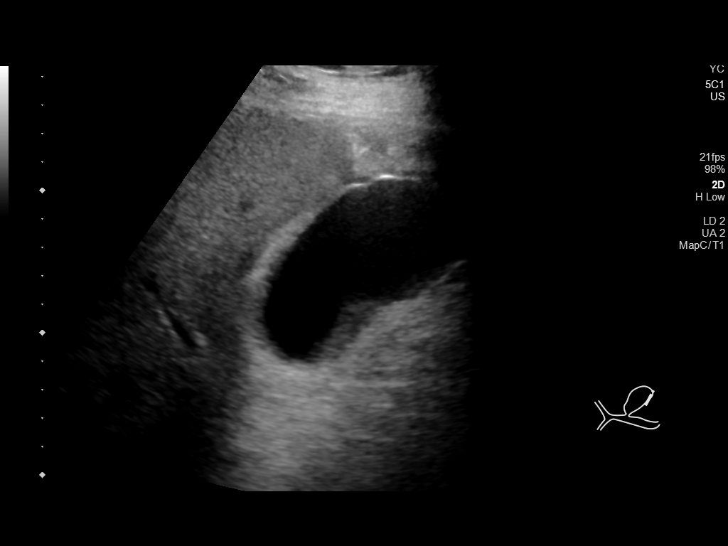
[im 18/48]
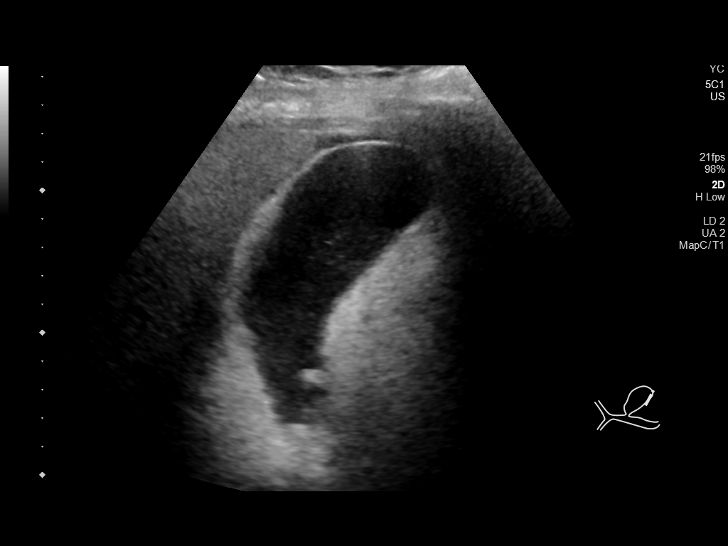
[im 22/48]
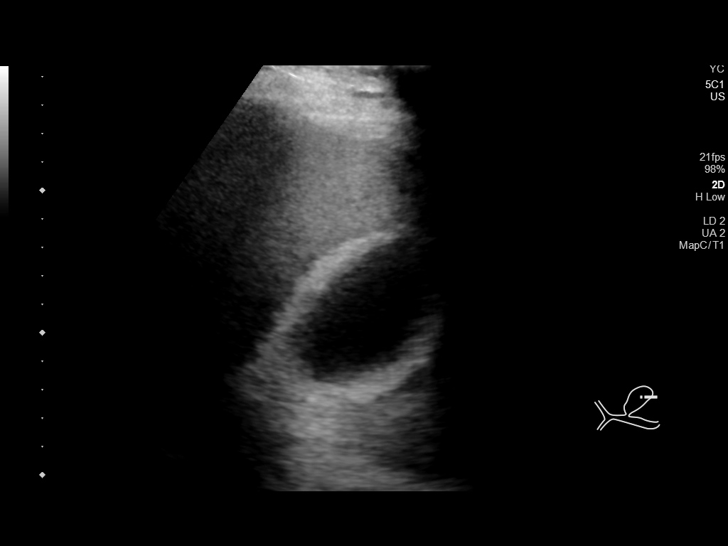
[im 26/48]
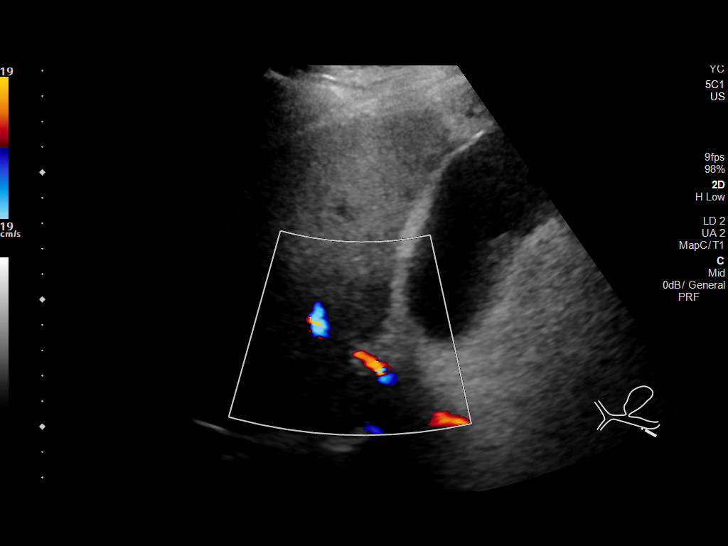
[im 30/48]
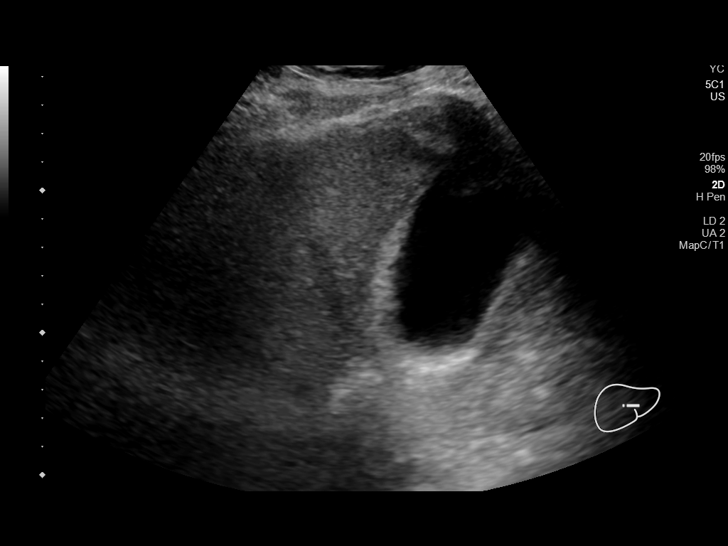
[im 32/48]
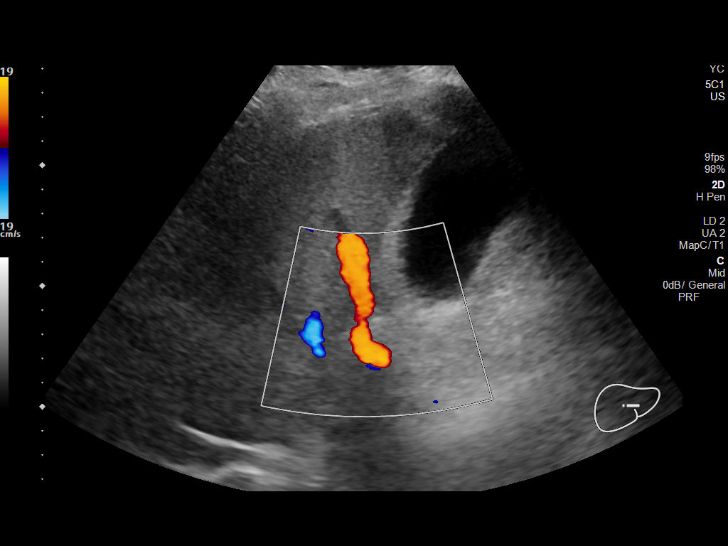
[im 36/48]
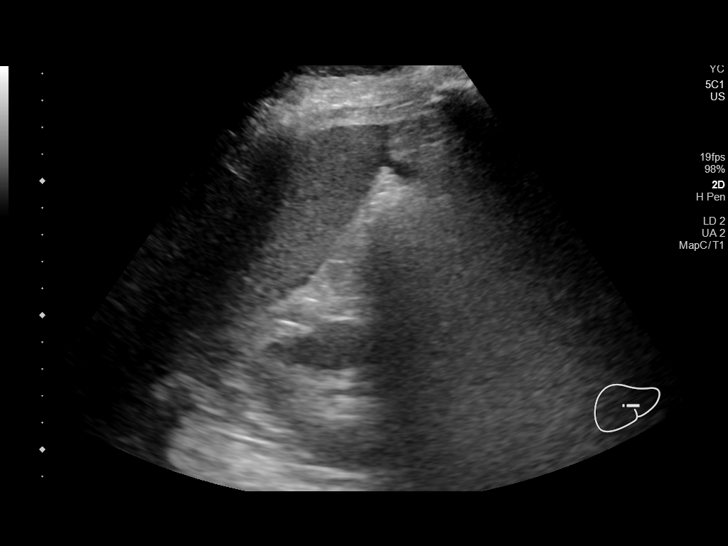
[im 40/48]
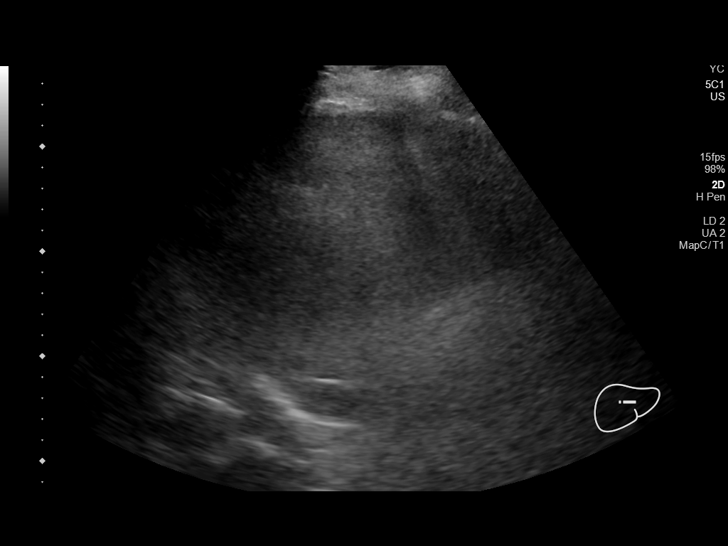
[im 44/48]
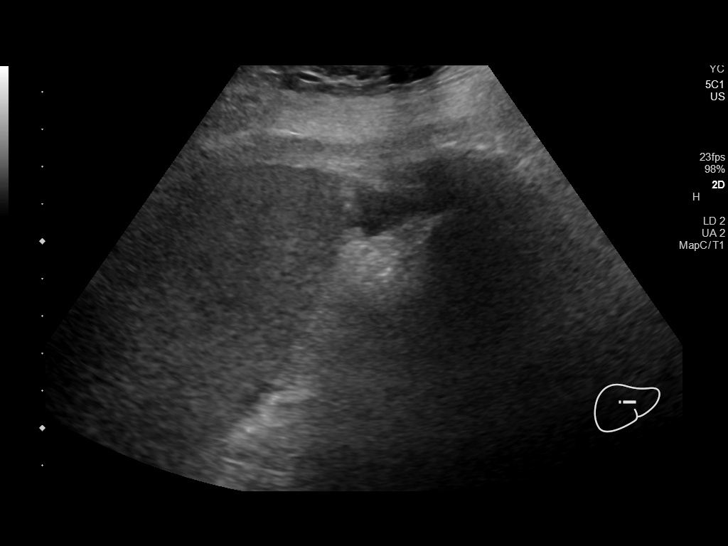
[im 48/48]
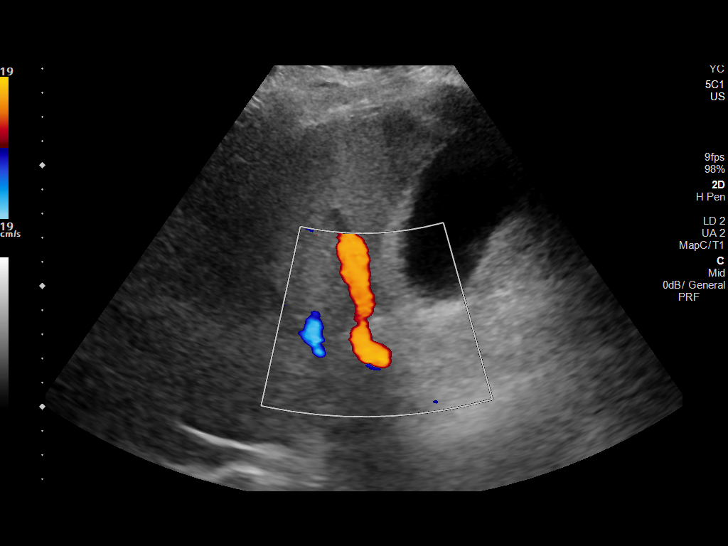

[14 of 25 positions shown; findings below may reference images not displayed]

FINDINGS: Gallbladder:

Distended with stones and sludge. Largest stone measures 1.2 cm.
Asymmetric gallbladder wall thickening measuring up to 6 mm. There
is trace pericholecystic fluid. No sonographic Murphy sign noted by
sonographer.

Common bile duct:

Diameter: 4 mm.

Liver:

No focal lesion identified. Borderline increased in parenchymal
echogenicity. Technically limited evaluation of the left hepatic
lobe. Portal vein is patent on color Doppler imaging with normal
direction of blood flow towards the liver.

Other: Small amount of pericholecystic fluid is well as free fluid
about the right lobe of the liver.
IMPRESSION: 1. Sonographic findings suspicious for acute cholecystitis.
Distended gallbladder with gallstones and sludge, mild wall
thickening and pericholecystic fluid. However, sonographic Murphy
sign is reportedly negative.
2. No biliary dilatation.
3. Borderline hepatic steatosis

## 2022-06-07 DIAGNOSIS — F329 Major depressive disorder, single episode, unspecified: Secondary | ICD-10-CM | POA: Diagnosis not present

## 2022-06-07 DIAGNOSIS — K219 Gastro-esophageal reflux disease without esophagitis: Secondary | ICD-10-CM | POA: Diagnosis not present

## 2022-06-07 DIAGNOSIS — F419 Anxiety disorder, unspecified: Secondary | ICD-10-CM | POA: Diagnosis not present

## 2022-06-07 DIAGNOSIS — E782 Mixed hyperlipidemia: Secondary | ICD-10-CM | POA: Diagnosis not present

## 2022-07-14 ENCOUNTER — Telehealth: Payer: BC Managed Care – PPO | Admitting: Nurse Practitioner

## 2022-07-14 ENCOUNTER — Encounter: Payer: Self-pay | Admitting: Nurse Practitioner

## 2022-07-14 DIAGNOSIS — J0140 Acute pansinusitis, unspecified: Secondary | ICD-10-CM

## 2022-07-14 MED ORDER — FLUTICASONE PROPIONATE 50 MCG/ACT NA SUSP: 2.0000 | Freq: Every day | NASAL | 12 refills | Status: AC

## 2022-07-14 MED ORDER — AMOXICILLIN-POT CLAVULANATE 875-125 MG PO TABS: 1.0000 | ORAL_TABLET | Freq: Two times a day (BID) | ORAL | 0 refills | Status: AC

## 2022-07-14 NOTE — Progress Notes (Signed)
Virtual Visit Consent   Gary Ramirez, you are scheduled for a virtual visit with a [facility] provider today. Just as with appointments in the office, your consent must be obtained to participate. Your consent will be active for this visit and any virtual visit you may have with one of our providers in the next 365 days. If you have a MyChart account, a copy of this consent can be sent to you electronically.  As this is a virtual visit, video technology does not allow for your provider to perform a traditional examination. This may limit your provider's ability to fully assess your condition. If your provider identifies any concerns that need to be evaluated in person or the need to arrange testing (such as labs, EKG, etc.), we will make arrangements to do so. Although advances in technology are sophisticated, we cannot ensure that it will always work on either your end or our end. If the connection with a video visit is poor, the visit may have to be switched to a telephone visit. With either a video or telephone visit, we are not always able to ensure that we have a secure connection.  By engaging in this virtual visit, you consent to the provision of healthcare and authorize for your insurance to be billed (if applicable) for the services provided during this visit. Depending on your insurance coverage, you may receive a charge related to this service.  I need to obtain your verbal consent now. Are you willing to proceed with your visit today? Gary Ramirez has provided verbal consent on 07/14/2022 for a virtual visit (video or telephone). Gary Simas, FNP  Date: 07/14/2022 4:55 PM  Virtual Visit via Video Note   I, Gary Ramirez, connected with  Gary Ramirez  ([id number], Jul 25, 1975) on 07/14/22 at  5:00 PM EDT by a video-enabled telemedicine application and verified that I am speaking with the correct person using two identifiers.  Location: Patient: Virtual Visit Location  Patient: Home Provider: Virtual Visit Location Provider: Home Office   I discussed the limitations of evaluation and management by telemedicine and the availability of in person appointments. The patient expressed understanding and agreed to proceed.    History of Present Illness: Gary Ramirez is a 47 y.o. who identifies as a male who was assigned male at birth, and is being seen today for sinus congestion  Symptom onset was about 10 days ago  He has been using Mucinex for relief  Initially had a sore throat that has resolved   Thought is was due to allergies initially   Symptoms today include green thick sinus drainage  Crusting to eyes in the am   No fevers   Has not had a sinus infection recently  Problems:  Patient Active Problem List   Diagnosis Date Noted   Cholecystitis, acute with cholelithiasis 08/13/2019   DKA (diabetic ketoacidoses) 08/07/2019   AKI (acute kidney injury) (HCC) 08/07/2019   Diabetes mellitus, new onset (HCC) 08/07/2019    Allergies: No Known Allergies Medications:  Current Outpatient Medications:    fenofibrate 54 MG tablet, Take 54 mg by mouth daily., Disp: , Rfl:    FLUoxetine (PROZAC) 40 MG capsule, Take 40 mg by mouth daily. , Disp: , Rfl:    ibuprofen (ADVIL) 800 MG tablet, Take 1 tablet (800 mg total) by mouth every 8 (eight) hours as needed., Disp: 30 tablet, Rfl: 0   insulin aspart (NOVOLOG) 100 UNIT/ML FlexPen, Inject 8 Units into the skin 3 (  three) times daily with meals., Disp: 15 mL, Rfl: 11   insulin detemir (LEVEMIR) 100 UNIT/ML injection, Inject 0.26 mLs (26 Units total) into the skin every 12 (twelve) hours., Disp: 10 mL, Rfl: 11   ondansetron (ZOFRAN-ODT) 4 MG disintegrating tablet, Take 4 mg by mouth every 8 (eight) hours as needed for nausea/vomiting., Disp: , Rfl:    oxyCODONE (OXY IR/ROXICODONE) 5 MG immediate release tablet, Take 1 tablet (5 mg total) by mouth every 6 (six) hours as needed for severe pain., Disp: 15 tablet,  Rfl: 0   pantoprazole (PROTONIX) 40 MG tablet, Take 1 tablet by mouth daily., Disp: , Rfl:   Observations/Objective: Patient is well-developed, well-nourished in no acute distress.  Resting comfortably  at home.  Head is normocephalic, atraumatic.  No labored breathing.  Speech is clear and coherent with logical content.  Patient is alert and oriented at baseline.    Assessment and Plan: 1. Acute non-recurrent pansinusitis Continue mucinex as needed  - amoxicillin-clavulanate (AUGMENTIN) 875-125 MG tablet; Take 1 tablet by mouth 2 (two) times daily for 7 days. Take with food  Dispense: 14 tablet; Refill: 0 - fluticasone (FLONASE) 50 MCG/ACT nasal spray; Place 2 sprays into both nostrils daily.  Dispense: 15.8 mL; Refill: 12     Follow Up Instructions: I discussed the assessment and treatment plan with the patient. The patient was provided an opportunity to ask questions and all were answered. The patient agreed with the plan and demonstrated an understanding of the instructions.  A copy of instructions were sent to the patient via MyChart unless otherwise noted below.    The patient was advised to call back or seek an in-person evaluation if the symptoms worsen or if the condition fails to improve as anticipated.  Time:  I spent 15 minutes with the patient via telehealth technology discussing the above problems/concerns.    Gary Simas, FNP

## 2022-10-08 ENCOUNTER — Ambulatory Visit: Payer: Self-pay

## 2022-10-08 DIAGNOSIS — L03113 Cellulitis of right upper limb: Secondary | ICD-10-CM | POA: Diagnosis not present

## 2022-10-10 DIAGNOSIS — S61259D Open bite of unspecified finger without damage to nail, subsequent encounter: Secondary | ICD-10-CM | POA: Diagnosis not present

## 2022-10-10 DIAGNOSIS — L03011 Cellulitis of right finger: Secondary | ICD-10-CM | POA: Diagnosis not present

## 2022-12-29 DIAGNOSIS — J069 Acute upper respiratory infection, unspecified: Secondary | ICD-10-CM | POA: Diagnosis not present
